# Patient Record
Sex: Female | Born: 1935 | Race: Black or African American | Hispanic: No | Smoking: Current every day smoker
Health system: Southern US, Community
[De-identification: ages and names within clinical notes are randomized; demographics above are authoritative.]

## PROBLEM LIST (undated history)

## (undated) DIAGNOSIS — I639 Cerebral infarction, unspecified: Secondary | ICD-10-CM

## (undated) DIAGNOSIS — I10 Essential (primary) hypertension: Secondary | ICD-10-CM

## (undated) DIAGNOSIS — E785 Hyperlipidemia, unspecified: Secondary | ICD-10-CM

---

## 2014-05-28 ENCOUNTER — Observation Stay: Payer: Self-pay | Admitting: Internal Medicine

## 2014-05-28 LAB — URINALYSIS, COMPLETE
BLOOD: NEGATIVE
Bilirubin,UR: NEGATIVE
GLUCOSE, UR: NEGATIVE mg/dL (ref 0–75)
KETONE: NEGATIVE
Nitrite: NEGATIVE
PH: 5 (ref 4.5–8.0)
Protein: NEGATIVE
SPECIFIC GRAVITY: 1.013 (ref 1.003–1.030)
Squamous Epithelial: 5

## 2014-05-28 LAB — COMPREHENSIVE METABOLIC PANEL
ALT: 13 U/L — AB
ANION GAP: 5 — AB (ref 7–16)
Albumin: 3.1 g/dL — ABNORMAL LOW (ref 3.4–5.0)
Alkaline Phosphatase: 114 U/L
BUN: 18 mg/dL (ref 7–18)
Bilirubin,Total: 0.3 mg/dL (ref 0.2–1.0)
CHLORIDE: 109 mmol/L — AB (ref 98–107)
CO2: 28 mmol/L (ref 21–32)
CREATININE: 1.69 mg/dL — AB (ref 0.60–1.30)
Calcium, Total: 8.5 mg/dL (ref 8.5–10.1)
EGFR (African American): 37 — ABNORMAL LOW
EGFR (Non-African Amer.): 31 — ABNORMAL LOW
Glucose: 80 mg/dL (ref 65–99)
OSMOLALITY: 284 (ref 275–301)
POTASSIUM: 4.4 mmol/L (ref 3.5–5.1)
SGOT(AST): 11 U/L — ABNORMAL LOW (ref 15–37)
Sodium: 142 mmol/L (ref 136–145)
Total Protein: 7.3 g/dL (ref 6.4–8.2)

## 2014-05-28 LAB — CBC WITH DIFFERENTIAL/PLATELET
Basophil #: 0.3 10*3/uL — ABNORMAL HIGH (ref 0.0–0.1)
Basophil %: 4.5 %
Eosinophil #: 0.2 10*3/uL (ref 0.0–0.7)
Eosinophil %: 3.2 %
HCT: 40.4 % (ref 35.0–47.0)
HGB: 13.5 g/dL (ref 12.0–16.0)
Lymphocyte #: 1.6 10*3/uL (ref 1.0–3.6)
Lymphocyte %: 28 %
MCH: 35.2 pg — AB (ref 26.0–34.0)
MCHC: 33.4 g/dL (ref 32.0–36.0)
MCV: 106 fL — AB (ref 80–100)
MONO ABS: 0.8 x10 3/mm (ref 0.2–0.9)
Monocyte %: 13.3 %
NEUTROS PCT: 51 %
Neutrophil #: 3 10*3/uL (ref 1.4–6.5)
PLATELETS: 236 10*3/uL (ref 150–440)
RBC: 3.83 10*6/uL (ref 3.80–5.20)
RDW: 14.3 % (ref 11.5–14.5)
WBC: 5.8 10*3/uL (ref 3.6–11.0)

## 2014-05-28 LAB — TROPONIN I

## 2014-05-28 LAB — APTT: ACTIVATED PTT: 26.8 s (ref 23.6–35.9)

## 2014-05-29 ENCOUNTER — Ambulatory Visit: Payer: Self-pay | Admitting: Neurology

## 2014-05-29 LAB — LIPID PANEL
Cholesterol: 159 mg/dL (ref 0–200)
HDL: 42 mg/dL (ref 40–60)
Ldl Cholesterol, Calc: 99 mg/dL (ref 0–100)
TRIGLYCERIDES: 90 mg/dL (ref 0–200)
VLDL Cholesterol, Calc: 18 mg/dL (ref 5–40)

## 2014-09-21 NOTE — Consult Note (Signed)
PATIENT NAME:  Carolyn Kline, Carolyn Kline MR#:  [id number] DATE OF BIRTH:  02-07-1929  DATE OF CONSULTATION:  05/29/2014   REFERRING PHYSICIAN:   CONSULTING PHYSICIAN:  Leotis Pain, MD  REASON FOR CONSULTATION: Left facial droop.   HISTORY OF PRESENT ILLNESS: This is an 81 year old African-American female with a questionable past medical history of stroke, on Aggrenox at baseline, who presents with what is described as a left facial droop. The patient is status post stroke workup including MRI of the brain which did not show any acute intracranial pathology.   REVIEW OF SYSTEMS: Denies fever, chills or fatigue. Denies any blurry or double vision. Denies any tinnitus. Denies any hearing loss. Denies any shortness of breath. Denies any cough. Denies any chest pain or abdominal pain. No heat or cold intolerance. Denies any anxiety or depression.   PAST MEDICAL HISTORY: Stroke in the past, essential hypertension, hyperlipidemia.   SOCIAL HISTORY: Positive for tobacco use at close to 1 pack per day.   FAMILY HISTORY: Positive for stroke and strong family history for smoking.   ALLERGIES: No known drug allergies.   HOME MEDICATIONS: Reviewed.   PHYSICAL EXAMINATION:  NEUROLOGIC: The patient is awake, alert and oriented to time, place, location and the reason why she is in the hospital. Sensation intact. A left facial droop is noted. The patient has difficulty completely closing her left eye. Shoulder shrug intact. Motor strength 5/5 in the bilateral upper and lower extremities Sensation intact to light touch and temperature. Reflexes diminished. No tinnitus present.   ASSESSMENT AND PLAN: This is an 81 year old African American female who is presenting with left facial droop and negative MRI. The patient's symptoms are consistent with Bell palsy on the left side, peripheral loss of the seventh nerve. The patient is able to close her left eye, but it is weaker than her eye right eye. Since she is able to  close it completely, I do not think she needs Lacri-Lube at this point.   PLAN: Discharge planning. I am not so convinced the patient needs it at this time, specifically since she is an elderly patient. Follow up as an outpatient as needed. This case was discussed with the family at the bedside.    ____________________________ Leotis Pain, MD yz:JT D: 05/29/2014 13:15:44 ET T: 05/29/2014 15:34:03 ET JOB#: [id number]  cc: Leotis Pain, MD, <Dictator> Leotis Pain MD ELECTRONICALLY SIGNED 06/14/2014 21:10

## 2014-09-21 NOTE — H&P (Signed)
PATIENT NAME:  BARBA, Carolyn Kline MR#:  [id number] DATE OF BIRTH:  December 28, 1928  DATE OF ADMISSION:  05/28/2014  REFERRING PHYSICIAN: Debbrah Alar, MD  PRIMARY CARE PHYSICIAN: Dr. Scarlett Presto out of [facility].   CHIEF COMPLAINT: Left face droop.   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 She eventually came after the insistence of her family who noticed acute onset of  facial droop as stated above. However, denies any further symptomatology, including paralysis, paresthesias, or further weakness. No slurred speech or further symptoms per patient and family members at bedside. Symptoms have somewhat improved, but still present.   REVIEW OF SYSTEMS:  CONSTITUTIONAL: Denies fevers, chills, fatigue.  EYES: Denies blurred vision, double vision, or eye pain.  HEENT: Denies tinnitus, ear pain, or hearing loss.  RESPIRATORY: Denies cough, wheeze, or shortness of breath.  CARDIOVASCULAR: No chest pain, palpitations, or edema.  GASTROINTESTINAL: Denies nausea, vomiting, diarrhea, or abdominal pain.  GENITOURINARY: Denies dysuria or hematuria.  ENDOCRINE: Denies nocturia or thyroid problems.  HEMATOLOGIC/LYMPHATIC: Denies easy bruising or bleeding.  SKIN: Denies rashes or lesions.  MUSCULOSKELETAL: Denies pain in neck, back, shoulder, knees, hips, or arthritic symptoms.  NEUROLOGIC: Positive for left-sided facial droop as stated above. Denies any further numbness, weakness, dysarthria, ataxia, or headaches.  PSYCHIATRIC: Denies anxiety or depressive symptoms.   Otherwise, full review of systems performed by me is negative.   PAST MEDICAL HISTORY: Includes CVA without residual neurological deficits, essential hypertension, hyperlipidemia--unspecified.   SOCIAL HISTORY:  Positive for tobacco use. Denies any alcohol or drug use.   FAMILY HISTORY: Positive for CVA as well as coronary artery disease.   ALLERGIES: No known drug allergies.   HOME MEDICATIONS: Include lisinopril 40 mg p.o. daily, simvastatin unknown dosage daily at bedtime, Aggrenox 25/200 mg p.o. b.i.d.   PHYSICAL EXAMINATION:  VITAL SIGNS: Temperature 98.1, heart rate 59, respirations 16, blood pressure 140/73, saturating 100% on room air. Weight 60.3 kg, BMI 22.1.  GENERAL: Weak-appearing African American female currently in no acute distress.  HEAD: Normocephalic, atraumatic.  EYES: Pupils equal, round, and reactive to light. Extraocular muscles intact. No scleral icterus.  MOUTH: Moist mucosal membranes. Dentition intact. No abscess noted.  EARS, NOSE, THROAT: Clear without exudates. No external lesions.  NECK: Supple. No thyromegaly. No nodules. No JVD.  PULMONARY: Clear to auscultation bilaterally without wheeze, rales, or rhonchi. No use of accessory muscles. Good respiratory effort.  CHEST: Nontender to palpation.  CARDIOVASCULAR: S1, S2. Regular rate and rhythm. No murmurs, rubs, or gallops. No edema. Pedal pulses 2+ bilaterally.  GASTROINTESTINAL: Soft, nontender, nondistended. No masses. Positive bowel sounds. No hepatosplenomegaly.  MUSCULOSKELETAL: No swelling, clubbing, or edema. Range of motion full in all extremities.  NEUROLOGIC: Prominent left-sided facial droop. Pronator drift within normal limits. Strength 5/5 in all extremities, including proximal and distal flexion and extension. Sensation intact. Reflexes intact.  SKIN: No ulceration, lesions, rashes, or cyanosis. Skin warm, dry. Turgor intact.  PSYCHIATRIC: Mood and affect are flattened. She is awake, alert, oriented x 3. Insight and judgment are intact.   LABORATORY DATA: Sodium 142, potassium 4.4, chloride 109, bicarbonate 28, BUN 18, creatinine 1.69, unknown baseline, glucose 80. Albumin of 3.1, otherwise LFTs within  normal limits. WBC 5.8, hemoglobin 13.5, platelets of 236,000. CT head performed which reveals remote-appearing white matter infarction of the right caudate,  as well as small left cortical cerebral hemisphere infarct. EKG performed reveals sinus bradycardia, heart rate 58, with right bundle branch block. No old to compare to.   ASSESSMENT AND PLAN: An 81 year old African American female with history of cerebrovascular accident without residual neurological deficit, presenting with left-sided facial drooping for the last 24 hours.  1.  Cerebrovascular accident, unspecified mechanism: Place on telemetry. Neurological checks every 4 hours. We are going to check a lipid panel, MRI, carotid Doppler, transthoracic echocardiogram. Continue with Aggrenox and statin therapy.  2.  Hypertension: She is presenting after 24 hours of initial onset of symptoms. We can continue her blood pressure medication, her lisinopril.  3.  Hyperlipidemia: Statin therapy.  4.  Venous thromboembolism prophylaxis with sequential compression devices.   CODE STATUS: The patient is FULL CODE.   TIME SPENT: 45 minutes.    ____________________________ Aaron Mose. Antavius Sperbeck, MD dkh:ts D: 05/28/2014 20:42:42 ET T: 05/28/2014 21:39:18 ET JOB#: [id number]  cc: Aaron Mose. Jossalin Chervenak, MD, <Dictator> Lauran Romanski Woodfin Ganja MD ELECTRONICALLY SIGNED 05/29/2014 20:37

## 2014-09-25 NOTE — Discharge Summary (Signed)
PATIENT NAME:  Carolyn Kline, Carolyn Kline MR#:  [id number] DATE OF BIRTH:  1928-09-12  DATE OF ADMISSION:  05/28/2014 DATE OF DISCHARGE:  05/29/2014  PRIMARY CARE PHYSICIAN: Non-local.  DISCHARGE DIAGNOSES:  1. Left facial droop, Bell palsy. 2. History of cerebrovascular accident. 3. Bilateral carotid and proximal internal carotid artery stenosis, less than 50%.  4. Renal insufficiency.  5. Hypertension.  6. Hyperlipidemia.  7. Tobacco abuse.   CONDITION: Stable.  CODE STATUS: Full code.   HOME MEDICATIONS: Please refer to the medication reconciliation list.   DIET: Low-sodium, low-fat, low-cholesterol diet.   ACTIVITY: As tolerated.  FOLLOW-UP CARE: Follow up with PCP within 1 to 2 weeks.   HOSPITAL COURSE: The patient is an 81 year old African-American female with a history of hypertension, CVA, hyperlipidemia, who presented to the ED with left facial droop. The patient denies any other symptoms. For detailed history and physical examination, please refer to the admission note dictated by Dr. Lavetta Nielsen. The patient was admitted for CVA After admission, the patient got an MRI of the brain, which did not show any acute cerebrovascular accident. Carotid duplex showed internal carotid artery stenosis less than 50%. The patient had echocardiograph. The patient has been treated with Aggrenox and statin. Hypertension has been controlled with lisinopril. The patient has no complaints except mild left-sided facial droop. The patient is clinically stable and will be discharged to home today after neurology consult evaluation. In addition, the patient has tobacco abuse, which was counseled for smoking cessation and we will give a nicotine patch. Per Dr. Irish Elders, patient has Bell's palsy.  I discussed the patient's discharge plan with the patient, the patient's family members, and nurse.  TIME SPENT: About 43 minutes..    ____________________________ Demetrios Loll, MD qc:jh D: 05/29/2014 14:51:24  ET T: 05/29/2014 18:02:08 ET JOB#: [id number]  cc: Demetrios Loll, MD, <Dictator> Demetrios Loll MD ELECTRONICALLY SIGNED 05/30/2014 17:04

## 2015-03-27 ENCOUNTER — Emergency Department: Payer: Medicare Other

## 2015-03-27 ENCOUNTER — Encounter: Payer: Self-pay | Admitting: Emergency Medicine

## 2015-03-27 ENCOUNTER — Emergency Department
Admission: EM | Admit: 2015-03-27 | Discharge: 2015-03-27 | Disposition: A | Discharge status: Home / Self Care | Payer: Medicare Other | Attending: Emergency Medicine | Admitting: Emergency Medicine

## 2015-03-27 DIAGNOSIS — Z79899 Other long term (current) drug therapy: Secondary | ICD-10-CM | POA: Insufficient documentation

## 2015-03-27 DIAGNOSIS — J189 Pneumonia, unspecified organism: Secondary | ICD-10-CM

## 2015-03-27 DIAGNOSIS — Z7982 Long term (current) use of aspirin: Secondary | ICD-10-CM | POA: Diagnosis not present

## 2015-03-27 DIAGNOSIS — Z720 Tobacco use: Secondary | ICD-10-CM | POA: Insufficient documentation

## 2015-03-27 DIAGNOSIS — R0602 Shortness of breath: Secondary | ICD-10-CM | POA: Diagnosis present

## 2015-03-27 DIAGNOSIS — I10 Essential (primary) hypertension: Secondary | ICD-10-CM | POA: Diagnosis not present

## 2015-03-27 DIAGNOSIS — J159 Unspecified bacterial pneumonia: Secondary | ICD-10-CM | POA: Diagnosis not present

## 2015-03-27 HISTORY — DX: Hyperlipidemia, unspecified: E78.5

## 2015-03-27 HISTORY — DX: Cerebral infarction, unspecified: I63.9

## 2015-03-27 HISTORY — DX: Essential (primary) hypertension: I10

## 2015-03-27 LAB — CBC
HEMATOCRIT: 40.3 % (ref 35.0–47.0)
Hemoglobin: 13.2 g/dL (ref 12.0–16.0)
MCH: 33.2 pg (ref 26.0–34.0)
MCHC: 32.7 g/dL (ref 32.0–36.0)
MCV: 101.4 fL — ABNORMAL HIGH (ref 80.0–100.0)
Platelets: 357 10*3/uL (ref 150–440)
RBC: 3.97 MIL/uL (ref 3.80–5.20)
RDW: 14.2 % (ref 11.5–14.5)
WBC: 8 10*3/uL (ref 3.6–11.0)

## 2015-03-27 LAB — BASIC METABOLIC PANEL
ANION GAP: 9 (ref 5–15)
BUN: 18 mg/dL (ref 6–20)
CALCIUM: 9.6 mg/dL (ref 8.9–10.3)
CO2: 25 mmol/L (ref 22–32)
Chloride: 104 mmol/L (ref 101–111)
Creatinine, Ser: 1.49 mg/dL — ABNORMAL HIGH (ref 0.44–1.00)
GFR, EST AFRICAN AMERICAN: 36 mL/min — AB (ref >60)
GFR, EST NON AFRICAN AMERICAN: 31 mL/min — AB (ref >60)
Glucose, Bld: 119 mg/dL — ABNORMAL HIGH (ref 65–99)
POTASSIUM: 3.9 mmol/L (ref 3.5–5.1)
SODIUM: 138 mmol/L (ref 135–145)

## 2015-03-27 LAB — TROPONIN I

## 2015-03-27 MED ORDER — AZITHROMYCIN 250 MG PO TABS
500.0000 mg | ORAL_TABLET | Freq: Once | ORAL | Status: AC
  Administered 2015-03-27: 500 mg via ORAL
  Filled 2015-03-27: qty 2

## 2015-03-27 MED ORDER — ALBUTEROL SULFATE HFA 108 (90 BASE) MCG/ACT IN AERS: 2.0000 | INHALATION_SPRAY | Freq: Four times a day (QID) | RESPIRATORY_TRACT | Status: DC | PRN

## 2015-03-27 MED ORDER — AZITHROMYCIN 250 MG PO TABS: 250.0000 mg | ORAL_TABLET | Freq: Every day | ORAL | Status: DC

## 2015-03-27 NOTE — ED Notes (Signed)
Pt [facility] from [facility] with c/o cough x 2 weeks and low o2 with walking.

## 2015-03-27 NOTE — Discharge Instructions (Signed)

## 2015-03-27 NOTE — ED Provider Notes (Signed)
[facility] Emergency Department Provider Note  ____________________________________________  Time seen: Approximately 130 PM  I have reviewed the triage vital signs and the nursing notes.   HISTORY  Chief Complaint Shortness of Breath and Cough    HPI Carolyn Kline is a 81 y.o. female with a history of hypertension who is presenting today with cough over the last 2 weeks. She has had significant sick contact of her relative with pneumonia. She denies any chest pain or shortness of breath at this time. Was seen at the urgent care this morning and was sent in for an oxygen saturation of 90% when walking.She says that she has not had any recent hospitalizations or antibiotics. Denies any fever at home.    Past Medical History  Diagnosis Date  . Hypertension   . CVA (cerebral infarction)   . Hyperlipemia     There are no active problems to display for this patient.   History reviewed. No pertinent past surgical history.  Current Outpatient Rx  Name  Route  Sig  Dispense  Refill  . calcium carbonate (OSCAL) 1500 (600 CA) MG TABS tablet   Oral   Take by mouth 2 (two) times daily with a meal.         . dipyridamole-aspirin (AGGRENOX) 200-25 MG 12hr capsule   Oral   Take 1 capsule by mouth 2 (two) times daily.         Marland Kitchen lisinopril (PRINIVIL,ZESTRIL) 40 MG tablet   Oral   Take 40 mg by mouth daily.         . simvastatin (ZOCOR) 40 MG tablet   Oral   Take 40 mg by mouth daily.           Allergies Review of patient's allergies indicates no known allergies.  No family history on file.  Social History Social History  Substance Use Topics  . Smoking status: Current Every Day Smoker  . Smokeless tobacco: None  . Alcohol Use: No    Review of Systems Constitutional: No fever/chills Eyes: No visual changes. ENT: No sore throat. Cardiovascular: Denies chest pain. Respiratory: Cough  Gastrointestinal: No abdominal pain.  No nausea, no  vomiting.  No diarrhea.  No constipation. Genitourinary: Negative for dysuria. Musculoskeletal: Negative for back pain. Skin: Negative for rash. Neurological: Negative for headaches, focal weakness or numbness.  10-point ROS otherwise negative.  ____________________________________________   PHYSICAL EXAM:  VITAL SIGNS: ED Triage Vitals  Enc Vitals Group     BP 03/27/15 1238 128/70 mmHg     Pulse Rate 03/27/15 1239 57     Resp --      Temp --      Temp src --      SpO2 03/27/15 1239 95 %     Weight 03/27/15 1111 110 lb (49.896 kg)     Height 03/27/15 1111 5\' 5"  (1.651 m)     Head Cir --      Peak Flow --      Pain Score 03/27/15 1112 0     Pain Loc --      Pain Edu? --      Excl. in [place]? --     Constitutional: Alert and oriented. Well appearing and in no acute distress. Eyes: Conjunctivae are normal. PERRL. EOMI. Head: Atraumatic. Nose: No congestion/rhinnorhea. Mouth/Throat: Mucous membranes are moist.  Oropharynx non-erythematous. Neck: No stridor.   Cardiovascular: Normal rate, regular rhythm. Grossly normal heart sounds.  Good peripheral circulation. Respiratory: Normal respiratory effort.  No retractions. Rales to the left lower field. Gastrointestinal: Soft and nontender. No distention. No abdominal bruits. No CVA tenderness. Musculoskeletal: No lower extremity tenderness nor edema.  No joint effusions. Neurologic:  Normal speech and language. No gross focal neurologic deficits are appreciated. No gait instability. Skin:  Skin is warm, dry and intact. No rash noted. Psychiatric: Mood and affect are normal. Speech and behavior are normal.  ____________________________________________   LABS (all labs ordered are listed, but only abnormal results are displayed)  Labs Reviewed  BASIC METABOLIC PANEL - Abnormal; Notable for the following:    Glucose, Bld 119 (*)    Creatinine, Ser 1.49 (*)    GFR calc non Af Amer 31 (*)    GFR calc Af Amer 36 (*)    All other  components within normal limits  CBC - Abnormal; Notable for the following:    MCV 101.4 (*)    All other components within normal limits  TROPONIN I   ____________________________________________  EKG  ED ECG REPORT I, Doran Stabler, the attending physician, personally viewed and interpreted this ECG.   Date: 03/27/2015  EKG Time: 1133  Rate: 70  Rhythm: normal sinus rhythm  Axis: Normal axis  Intervals:Incomplete right bundle-branch block  ST&T Change: No ST segment elevation or depression. T-wave inversion in lead V2.  No change from 05/28/2014. ____________________________________________  RADIOLOGY  Probable left lower lobe pneumonia. I personally reviewed these films. ____________________________________________   PROCEDURES   ____________________________________________   INITIAL IMPRESSION / ASSESSMENT AND PLAN / ED COURSE  Pertinent labs & imaging results that were available during my care of the patient were reviewed by me and considered in my medical decision making (see chart for details).  ----------------------------------------- 2:33 PM on 03/27/2015 -----------------------------------------  Patient continues to rest comfortably. Advised about reassuring lab workup. Also discussed stopping smoking because of COPD found on x-ray as well as pneumonia which could only stand to be exacerbated by continued smoking of the one half pack per day habit the patient has as of now.  The patient as well as the family understand and are willing to comply. The patient will be discharged to home with Zithromax. She will follow up with her primary care doctor at [facility] in [place] within the next 7 days. ____________________________________________   FINAL CLINICAL IMPRESSION(S) / ED DIAGNOSES  Acute pneumonia, community-acquired. Initial visit.    Orbie Pyo, MD 03/27/15 [id number]

## 2015-03-27 NOTE — ED Notes (Signed)
Today went to walk in clinic for cough past 2 weeks, non productive,lives with family

## 2019-03-25 ENCOUNTER — Other Ambulatory Visit: Payer: Self-pay

## 2019-03-25 ENCOUNTER — Ambulatory Visit: Payer: Medicare HMO | Attending: Gastroenterology

## 2019-03-25 DIAGNOSIS — M62838 Other muscle spasm: Secondary | ICD-10-CM | POA: Diagnosis not present

## 2019-03-25 DIAGNOSIS — R278 Other lack of coordination: Secondary | ICD-10-CM | POA: Insufficient documentation

## 2019-03-25 DIAGNOSIS — R2689 Other abnormalities of gait and mobility: Secondary | ICD-10-CM | POA: Insufficient documentation

## 2019-03-25 DIAGNOSIS — R293 Abnormal posture: Secondary | ICD-10-CM | POA: Insufficient documentation

## 2019-03-25 NOTE — Patient Instructions (Signed)
   EXhale on EXertion!!!!! (pushing, pulling, lifting, standing, etc.)  Good Education:  Whenever you exert force you need to exhale to allow the pressure to escape out of your vocal cords rather than be pressed down through your pelvic floor. Exhaling also helps engage your deep-core muscles to protect your back and pelvic floor.   1. Belly Breathing:  Stabilization: Diaphragmatic Breathing    Lie with knees bent, feet flat. Place one hand on stomach, other on chest. Breathe deeply through nose, lifting belly hand without any motion of hand on chest. Repeat __2__ times per set. Do __10__ sets per session. Perform every day, twice a day.

## 2019-03-25 NOTE — Therapy (Signed)
[place] MAIN [facility] SERVICES [address] [place], [place], [address] Phone: [phone]   Fax:  [phone]  Physical Therapy Evaluation The patient has been informed of current processes in place at Outpatient Rehab to protect patients from Covid-19 exposure including social distancing, schedule modifications, and new cleaning procedures. After discussing their particular risk with a therapist based on the patient's personal risk factors, the patient has decided to proceed with in-person therapy.  Patient Details  Name: Carolyn Kline MRN: [id number] Date of Birth: 23-Feb-1929 No data recorded  Encounter Date: 03/25/2019  PT End of Session - 03/25/19 1327    Visit Number  1    Number of Visits  10    Date for PT Re-Evaluation  06/03/19    Authorization - Visit Number  1    Authorization - Number of Visits  10    PT Start Time  1130    PT Stop Time  6301    PT Time Calculation (min)  65 min    Activity Tolerance  Patient tolerated treatment well;No increased pain    Behavior During Therapy  WFL for tasks assessed/performed       Past Medical History:  Diagnosis Date  . CVA (cerebral infarction)   . Hyperlipemia   . Hypertension     No past surgical history on file.  There were no vitals filed for this visit.    Pelvic Floor Physical Therapy Evaluation and Assessment  SCREENING  Falls in last 6 mo: No  Interpreter Needed?: No Interpreter Agency:  Interpreter Name  Interpreter ID  Patient Declined Interpreter   Patient signed [place] waiver    Patient's communication preference: English  Red Flags:  Have you had any night sweats? No  Unexplained weight loss? Yes (about 2 years ago)  Saddle anesthesia? -*Not asked this session  Unexplained changes in bowel or bladder habits? Yes (fecal incontinence is new in the last year)   SUBJECTIVE  Patient reports: Has to go to the bathroom all the time, every time she  drinks something. Has (formed stool before and after bathroom use) leakage and high urgency for a bowel movement. Urge related urinary incontinence (sitting position is a trigger). "I better go because it's coming anyway". Has had these symptoms for about a year - "at least since the first of the year". Patients does not drink a lot water. Loves coffee in the morning- about 2 cups- and drinking Ensure Shakes. Patient reports she has COPD. And that she has been "forgetting a lot" lately and "feels off balance" a lot.    ADL limitations: requiring upper extremity use; lifting items up and down.   *Unplanned weight loss over the last 2 years. Pt reports a big loss of appetite over the last year. Diet includes eating breakfast and dinner and "maybe snacks throughout the day".  If she's hungry for breakfast she will have Ensure or toast with egg. She often reports skipping dinner, and if she does she will make a sandwich/what ever the grandson cooks (often not hungry). She reports she has lost all smell and taste. Additionally patient reports she get very fatigued early in the day. Example, she will get up, clean the kitchen and then have to sleep for the rest of the day.   Reports she goes to her PCP once a year and at her annual at her OB-GYN this year they looked at her kidneys. Planned pessiery (next 6 weeks).  Precautions:  N/A  Social/Family/Vocational History:   "Works in the house." 1 story home. About 4-5 STE, 1 railing present and wall on the other side. Lives with your grandson- works at [employer].   Recent Procedures/Tests/Findings:  "says the vagina is pushing up against the rectum" Often feels like there is a bulge around her vagina when she goes to the bathroom "feels like a penis is going to come out"   Obstetrical History: 5 children (6 grandchildren)   Gynecological History: Unknown   Urinary History: Unknown- frequent urination; feeling of incomplete voiding.   Gastrointestinal  History: Leakage; no strain to have a bowel movement. Stage 3 anterior prolapse [facility])    Sexual activity/pain: Not sexually active.   Location of pain: "I don't feel pain"   Patient Goals: More strength in my arms.    OBJECTIVE  Posture/Observations:  Sitting: anterior pelvic tilt; rounded shoulders  Standing: anterior pelvic tilt; L shoulder up; R shoulder down; minimal R lateral lean    Palpation/Segmental Motion/Joint Play: - Hypomobility throughout cervical/thoracic spine v Hypermobility along lumbar spine  -Hypomobility at T/L junction v Hyper mobility at L/S junction  - Hypomobility along sacral borders.  - TTP B illiacus, psoas, adductors   Special tests:   Supine to long sit: L LE longer; R up-slip noted in supine.   LLD: <1cm difference L longer than R (R up-slip and anterior innominate rotation)   Range of Motion/Flexibilty:  Spine: About 35% of rotation; Side bending more movement to the R;  L C-spine curve  Hips: R up-slip in supine; L leg longer in supine   * no full IR > ER ROM in seated position.  * L hamstrings are more flexible compared to R.    Strength/MMT:  LE MMT  LE MMT Left Right  Hip flex:  (L2) 5/5 5/5  Hip ext: /5 /5  Hip abd: /5 /5  Hip add: /5 /5  Hip IR /5 /5  Hip ER /5 /5    Abdominal:  Palpation: increased fascial tension at B lower quadrants; poor coordination of deep core with coughing; poor rib excursion with breathing.   Diastasis: 1 finger with abdominal engagement; <1 finger with SLR; decreased coordination   Pelvic Floor-- Deferred to follow up  External Exam: Introitus Appears:  Skin integrity:  Palpation: Cough: Prolapse visible?: Scar mobility:  Internal Vaginal Exam: Strength (PERF):  Symmetry: Palpation: Prolapse:   Internal Rectal Exam: Strength (PERF): Symmetry: Palpation: Prolapse:   Gait Analysis:  Minimal R lateral lean; decreased gait speed; decreased hip extension    Pelvic Floor Outcome  Measures: Female NIH: 14/43 (33%); FISI 27/72 (score >30 are associated with impaired QOL)   INTERVENTIONS THIS SESSION: There Act: Diaphragmatic breathing in hook-lying for decreased intraabdominal pressure with tactile cues. (10 minutes)   Self Care; Educated on the structure and function of the pelvic floor in relation to their symptoms as well as the POC, and initial HEP in order to set patient expectations and understanding from which we will build on in the future sessions. Sharren [place] can analogy given in written format on HEP). (20 minutes)    Total time: 65 minutes                 Objective measurements completed on examination: See above findings.                PT Short Term Goals - 03/25/19 1414      PT SHORT TERM GOAL #1   Title  Patient will  demonstrate functional recruitment of TA with breathing, sit-to-stand, squatting/lifting, and walking to allow for improved pelvic brace coordination, improved balance, and decreased downward pressure on the pelvic organs.    Baseline  10-29: poor coordination of deep core; weak rib excursion with breathing; DR present    Time  5    Period  Weeks    Status  New    Target Date  04/29/19      PT SHORT TERM GOAL #2   Title  Patient will demonstrate HEP x1 in the clinic to demonstrate understanding and proper form to allow for further improvement.    Baseline  10-29: Soda Can analogy given    Time  5    Period  Weeks    Status  New    Target Date  04/29/19      PT SHORT TERM GOAL #3   Title  Patient will demonstrate improved water intake (6-8 cups a day) throughout the day- assisting with improved hydration to assit with adequate urine and bowel movement output.    Baseline  10-29: <1 cup of water a day.    Time  5    Period  Weeks    Status  New    Target Date  04/29/19      PT SHORT TERM GOAL #4   Title  Pt will report being able to lift during ADLs without impaired fatigue following, in order to maintain  patients independence and reduce risk of falls.    Baseline  10-29: lifting objects overhead are difficult, impacting QOL and ADLs.    Time  5    Period  Weeks    Status  New    Target Date  04/29/19        PT Long Term Goals - 03/25/19 1419      PT LONG TERM GOAL #1   Title  Patient will report no episodes of fecal incontinence over the course of the prior two weeks to demonstrate improved functional ability.    Baseline  10-29 solid stool incontience before and after BM (at least 1x/day)    Time  10    Period  Weeks    Status  New    Target Date  06/03/19      PT LONG TERM GOAL #2   Title  Patient will score less than or equal to 15% on the Female NIH-CPSI to demonstrate a reduction in pain, urinary symptoms, and an improved quality of life.    Baseline  10-29: 33%    Time  10    Period  Weeks    Status  New    Target Date  06/03/19      PT LONG TERM GOAL #3   Title  Pt will report <10/72 on Fecal Incontience Severity Index (FISI) indicating improved fecal control.    Baseline  10-29 27/72    Time  10    Period  Weeks    Status  New    Target Date  06/03/19             Plan - 03/25/19 1328    Clinical Impression Statement  Patient is an 85 y.o. female who comes to PT with chief complaint of fecal incontience, frequent urinary voiding, and feeling of incomplete voiding. Patient has a relevant PMH of HTN, HLD, COPD and CVA (of unknown year). From pt's chart revealed grade 3 anterior compartment prolapse. Physical therapy eval revealed spinal L C-curve, R up-slip, diastisi recti, poor  coordination of deep core (with stress), TTP along hip flexors and hip stabilzing musculature. This implies that pelvic alignment, musculature spasms and posture play a component in patient's symptoms. Patient will benefit from skilled pelvic health PT to address prolapse, pelvic alignment, muscle spasms, coordination and pt education to reduce fecal incontinence and urinary related symptoms.    Patient demonstrates concerns for possible other diagnoses- these concerns are derived from unexplained weight loss, possible reduced sensation ("I don't feel pain"), and reduced appetite.    Personal Factors and Comorbidities  Age;Behavior Pattern;Comorbidity 1    Comorbidities  COPD; low BMI (underweight 18.3)    Examination-Activity Limitations  Bed Mobility;Continence;Lift;Locomotion Level    Examination-Participation Restrictions  Community Activity;Driving;Interpersonal Relationship    Stability/Clinical Decision Making  Evolving/Moderate complexity    Clinical Decision Making  Moderate    Rehab Potential  Good    PT Frequency  1x / week   pt may be limited by personal factors   PT Duration  Other (comment)   10 weeks   PT Treatment/Interventions  ADLs/Self Care Home Management;Biofeedback;Electrical Stimulation;Moist Heat;Traction;Stair training;Gait training;Balance training;Therapeutic exercise;Patient/family education;Therapeutic activities;Functional mobility training;Neuromuscular re-education;Manual techniques;Energy conservation;Vestibular;Spinal Manipulations;Joint Manipulations;Cryotherapy;Dry needling    PT Next Visit Plan  *ask abuse/adv directives etc; correct pelvic alignment; possible heel lift given; address TPs; increase core stability    PT Home Exercise Plan  diaphragmatic breathing    Consulted and Agree with Plan of Care  Patient;Family member/caregiver    Family Member Consulted  pt's daughter       Patient will benefit from skilled therapeutic intervention in order to improve the following deficits and impairments:  Abnormal gait, Decreased activity tolerance, Decreased coordination, Decreased balance, Decreased endurance, Decreased mobility, Decreased strength, Hypomobility, Increased fascial restricitons, Impaired flexibility, Increased muscle spasms, Postural dysfunction, Decreased range of motion  Visit Diagnosis: Other muscle spasm  Abnormal  posture  Other lack of coordination  Other abnormalities of gait and mobility     Problem List There are no active problems to display for this patient.   Tomasita Morrow, SPT  03/25/2019, 2:37 PM  [place] MAIN [facility] SERVICES [address] [place], [place], [address] Phone: [phone]   Fax:  [phone]  Name: Carolyn Kline MRN: [id number] Date of Birth: 1928-10-15

## 2019-04-01 ENCOUNTER — Ambulatory Visit: Payer: Medicare HMO | Attending: Gastroenterology

## 2019-04-08 ENCOUNTER — Ambulatory Visit: Payer: Medicare HMO

## 2019-04-15 ENCOUNTER — Ambulatory Visit: Payer: Medicare HMO

## 2019-04-29 ENCOUNTER — Ambulatory Visit: Payer: Medicare HMO

## 2019-05-06 ENCOUNTER — Ambulatory Visit: Payer: Medicare HMO

## 2019-05-13 ENCOUNTER — Ambulatory Visit: Payer: Medicare HMO

## 2019-07-15 ENCOUNTER — Other Ambulatory Visit: Payer: Self-pay

## 2019-07-15 ENCOUNTER — Inpatient Hospital Stay (HOSPITAL_COMMUNITY)
Admission: AD | Admit: 2019-07-15 | Discharge: 2019-07-17 | DRG: 177 | Disposition: A | Discharge status: Home Health | Payer: Medicare HMO | Source: Other Acute Inpatient Hospital | Attending: Internal Medicine | Admitting: Internal Medicine

## 2019-07-15 ENCOUNTER — Emergency Department: Payer: Medicare HMO

## 2019-07-15 ENCOUNTER — Encounter (HOSPITAL_COMMUNITY): Payer: Self-pay | Admitting: Internal Medicine

## 2019-07-15 ENCOUNTER — Inpatient Hospital Stay
Admission: EM | Admit: 2019-07-15 | Discharge: 2019-07-15 | DRG: 177 | Disposition: A | Discharge status: Other Acute Inpatient Hospital | Payer: Medicare HMO | Attending: Internal Medicine | Admitting: Internal Medicine

## 2019-07-15 DIAGNOSIS — Z8673 Personal history of transient ischemic attack (TIA), and cerebral infarction without residual deficits: Secondary | ICD-10-CM

## 2019-07-15 DIAGNOSIS — E860 Dehydration: Secondary | ICD-10-CM | POA: Diagnosis present

## 2019-07-15 DIAGNOSIS — E785 Hyperlipidemia, unspecified: Secondary | ICD-10-CM | POA: Diagnosis present

## 2019-07-15 DIAGNOSIS — N170 Acute kidney failure with tubular necrosis: Secondary | ICD-10-CM

## 2019-07-15 DIAGNOSIS — I129 Hypertensive chronic kidney disease with stage 1 through stage 4 chronic kidney disease, or unspecified chronic kidney disease: Secondary | ICD-10-CM | POA: Diagnosis present

## 2019-07-15 DIAGNOSIS — N1831 Chronic kidney disease, stage 3a: Secondary | ICD-10-CM | POA: Diagnosis present

## 2019-07-15 DIAGNOSIS — J9601 Acute respiratory failure with hypoxia: Secondary | ICD-10-CM | POA: Diagnosis present

## 2019-07-15 DIAGNOSIS — A0839 Other viral enteritis: Secondary | ICD-10-CM | POA: Diagnosis not present

## 2019-07-15 DIAGNOSIS — U071 COVID-19: Secondary | ICD-10-CM | POA: Diagnosis present

## 2019-07-15 DIAGNOSIS — I10 Essential (primary) hypertension: Secondary | ICD-10-CM | POA: Diagnosis not present

## 2019-07-15 DIAGNOSIS — F17200 Nicotine dependence, unspecified, uncomplicated: Secondary | ICD-10-CM | POA: Diagnosis present

## 2019-07-15 DIAGNOSIS — J1282 Pneumonia due to coronavirus disease 2019: Secondary | ICD-10-CM | POA: Diagnosis present

## 2019-07-15 DIAGNOSIS — N179 Acute kidney failure, unspecified: Secondary | ICD-10-CM | POA: Diagnosis present

## 2019-07-15 DIAGNOSIS — Z66 Do not resuscitate: Secondary | ICD-10-CM | POA: Diagnosis present

## 2019-07-15 DIAGNOSIS — F0150 Vascular dementia without behavioral disturbance: Secondary | ICD-10-CM | POA: Diagnosis present

## 2019-07-15 DIAGNOSIS — R350 Frequency of micturition: Secondary | ICD-10-CM | POA: Diagnosis present

## 2019-07-15 DIAGNOSIS — Z79899 Other long term (current) drug therapy: Secondary | ICD-10-CM | POA: Diagnosis not present

## 2019-07-15 DIAGNOSIS — R197 Diarrhea, unspecified: Secondary | ICD-10-CM | POA: Diagnosis present

## 2019-07-15 DIAGNOSIS — J9600 Acute respiratory failure, unspecified whether with hypoxia or hypercapnia: Secondary | ICD-10-CM | POA: Diagnosis present

## 2019-07-15 DIAGNOSIS — Z23 Encounter for immunization: Secondary | ICD-10-CM

## 2019-07-15 DIAGNOSIS — E875 Hyperkalemia: Secondary | ICD-10-CM | POA: Diagnosis not present

## 2019-07-15 LAB — HEPATIC FUNCTION PANEL
ALT: 18 U/L (ref 0–44)
AST: 44 U/L — ABNORMAL HIGH (ref 15–41)
Albumin: 3.1 g/dL — ABNORMAL LOW (ref 3.5–5.0)
Alkaline Phosphatase: 63 U/L (ref 38–126)
Bilirubin, Direct: <0.1 mg/dL (ref 0.0–0.2)
Total Bilirubin: 0.4 mg/dL (ref 0.3–1.2)
Total Protein: 7.5 g/dL (ref 6.5–8.1)

## 2019-07-15 LAB — BASIC METABOLIC PANEL
Anion gap: 11 (ref 5–15)
BUN: 53 mg/dL — ABNORMAL HIGH (ref 8–23)
CO2: 21 mmol/L — ABNORMAL LOW (ref 22–32)
Calcium: 8.5 mg/dL — ABNORMAL LOW (ref 8.9–10.3)
Chloride: 108 mmol/L (ref 98–111)
Creatinine, Ser: 3.03 mg/dL — ABNORMAL HIGH (ref 0.44–1.00)
GFR calc Af Amer: 15 mL/min — ABNORMAL LOW (ref >60)
GFR calc non Af Amer: 13 mL/min — ABNORMAL LOW (ref >60)
Glucose, Bld: 133 mg/dL — ABNORMAL HIGH (ref 70–99)
Potassium: 4.5 mmol/L (ref 3.5–5.1)
Sodium: 140 mmol/L (ref 135–145)

## 2019-07-15 LAB — CBC
HCT: 41.8 % (ref 36.0–46.0)
Hemoglobin: 13.6 g/dL (ref 12.0–15.0)
MCH: 33.3 pg (ref 26.0–34.0)
MCHC: 32.5 g/dL (ref 30.0–36.0)
MCV: 102.5 fL — ABNORMAL HIGH (ref 80.0–100.0)
Platelets: 173 10*3/uL (ref 150–400)
RBC: 4.08 MIL/uL (ref 3.87–5.11)
RDW: 14 % (ref 11.5–15.5)
WBC: 6.5 10*3/uL (ref 4.0–10.5)
nRBC: 0 % (ref 0.0–0.2)

## 2019-07-15 LAB — PROCALCITONIN: Procalcitonin: 0.86 ng/mL

## 2019-07-15 LAB — POC SARS CORONAVIRUS 2 AG: SARS Coronavirus 2 Ag: POSITIVE — AB

## 2019-07-15 LAB — ABO/RH: ABO/RH(D): O POS

## 2019-07-15 LAB — C-REACTIVE PROTEIN: CRP: 11.2 mg/dL — ABNORMAL HIGH (ref <1.0)

## 2019-07-15 LAB — TROPONIN I (HIGH SENSITIVITY): Troponin I (High Sensitivity): 118 ng/L (ref <18)

## 2019-07-15 LAB — FIBRIN DERIVATIVES D-DIMER (ARMC ONLY): Fibrin derivatives D-dimer (ARMC): >7500 ng/mL (FEU) — ABNORMAL HIGH (ref 0.00–499.00)

## 2019-07-15 LAB — FERRITIN: Ferritin: 533 ng/mL — ABNORMAL HIGH (ref 11–307)

## 2019-07-15 MED ORDER — HYDROCOD POLST-CPM POLST ER 10-8 MG/5ML PO SUER: 5.0000 mL | Freq: Two times a day (BID) | ORAL | Status: DC | PRN

## 2019-07-15 MED ORDER — SODIUM CHLORIDE 0.9 % IV BOLUS
1000.0000 mL | Freq: Once | INTRAVENOUS | Status: AC
  Administered 2019-07-15: 1000 mL via INTRAVENOUS

## 2019-07-15 MED ORDER — ENOXAPARIN SODIUM 40 MG/0.4ML ~~LOC~~ SOLN: 40.0000 mg | SUBCUTANEOUS | Status: DC

## 2019-07-15 MED ORDER — ASCORBIC ACID 500 MG PO TABS
500.0000 mg | ORAL_TABLET | Freq: Every day | ORAL | Status: DC
  Administered 2019-07-16 – 2019-07-17 (×2): 500 mg via ORAL
  Filled 2019-07-15 (×2): qty 1

## 2019-07-15 MED ORDER — SODIUM CHLORIDE 0.9 % IV SOLN
100.0000 mg | Freq: Every day | INTRAVENOUS | Status: DC
  Filled 2019-07-15: qty 20

## 2019-07-15 MED ORDER — ACETAMINOPHEN 650 MG RE SUPP: 650.0000 mg | Freq: Four times a day (QID) | RECTAL | Status: DC | PRN

## 2019-07-15 MED ORDER — GUAIFENESIN-DM 100-10 MG/5ML PO SYRP
10.0000 mL | ORAL_SOLUTION | ORAL | Status: DC | PRN
  Filled 2019-07-15: qty 10

## 2019-07-15 MED ORDER — GUAIFENESIN-DM 100-10 MG/5ML PO SYRP: 10.0000 mL | ORAL_SOLUTION | ORAL | Status: DC | PRN

## 2019-07-15 MED ORDER — HYDROCOD POLST-CPM POLST ER 10-8 MG/5ML PO SUER: 5.0000 mL | Freq: Two times a day (BID) | ORAL | 0 refills | Status: DC | PRN

## 2019-07-15 MED ORDER — DOCUSATE SODIUM 100 MG PO CAPS
100.0000 mg | ORAL_CAPSULE | Freq: Every day | ORAL | Status: DC
  Administered 2019-07-16 – 2019-07-17 (×2): 100 mg via ORAL
  Filled 2019-07-15 (×2): qty 1

## 2019-07-15 MED ORDER — SODIUM CHLORIDE 0.9 % IV SOLN
200.0000 mg | Freq: Once | INTRAVENOUS | Status: AC
  Administered 2019-07-15: 200 mg via INTRAVENOUS
  Filled 2019-07-15: qty 200

## 2019-07-15 MED ORDER — ASPIRIN-DIPYRIDAMOLE ER 25-200 MG PO CP12
1.0000 | ORAL_CAPSULE | Freq: Two times a day (BID) | ORAL | Status: DC
  Administered 2019-07-16 – 2019-07-17 (×3): 1 via ORAL
  Filled 2019-07-15 (×5): qty 1

## 2019-07-15 MED ORDER — ACETAMINOPHEN 325 MG PO TABS: 650.0000 mg | ORAL_TABLET | Freq: Four times a day (QID) | ORAL | Status: DC | PRN

## 2019-07-15 MED ORDER — ZINC SULFATE 220 (50 ZN) MG PO CAPS
220.0000 mg | ORAL_CAPSULE | Freq: Every day | ORAL | Status: DC
  Administered 2019-07-15 – 2019-07-17 (×3): 220 mg via ORAL
  Filled 2019-07-15 (×2): qty 1

## 2019-07-15 MED ORDER — SODIUM CHLORIDE 0.9% FLUSH: 3.0000 mL | Freq: Once | INTRAVENOUS | Status: DC

## 2019-07-15 MED ORDER — GUAIFENESIN-DM 100-10 MG/5ML PO SYRP: 10.0000 mL | ORAL_SOLUTION | ORAL | 0 refills | Status: DC | PRN

## 2019-07-15 MED ORDER — ONDANSETRON HCL 4 MG/2ML IJ SOLN: 4.0000 mg | Freq: Four times a day (QID) | INTRAMUSCULAR | Status: DC | PRN

## 2019-07-15 MED ORDER — ONDANSETRON HCL 4 MG PO TABS: 4.0000 mg | ORAL_TABLET | Freq: Four times a day (QID) | ORAL | Status: DC | PRN

## 2019-07-15 MED ORDER — DEXAMETHASONE SODIUM PHOSPHATE 10 MG/ML IJ SOLN
10.0000 mg | Freq: Once | INTRAMUSCULAR | Status: AC
  Administered 2019-07-15: 10 mg via INTRAVENOUS
  Filled 2019-07-15: qty 1

## 2019-07-15 MED ORDER — DEXAMETHASONE 6 MG PO TABS
6.0000 mg | ORAL_TABLET | Freq: Every day | ORAL | Status: DC
  Administered 2019-07-16 – 2019-07-17 (×2): 6 mg via ORAL
  Filled 2019-07-15 (×2): qty 1

## 2019-07-15 MED ORDER — ASPIRIN-DIPYRIDAMOLE ER 25-200 MG PO CP12: 1.0000 | ORAL_CAPSULE | Freq: Two times a day (BID) | ORAL | Status: DC

## 2019-07-15 MED ORDER — SODIUM CHLORIDE 0.9 % IV SOLN: INTRAVENOUS | Status: DC

## 2019-07-15 MED ORDER — SIMVASTATIN 20 MG PO TABS
40.0000 mg | ORAL_TABLET | Freq: Every day | ORAL | Status: DC
  Administered 2019-07-16 – 2019-07-17 (×2): 40 mg via ORAL
  Filled 2019-07-15 (×2): qty 2

## 2019-07-15 MED ORDER — ZINC SULFATE 220 (50 ZN) MG PO CAPS: 220.0000 mg | ORAL_CAPSULE | Freq: Every day | ORAL | 0 refills | Status: DC

## 2019-07-15 MED ORDER — LACTATED RINGERS IV SOLN: INTRAVENOUS | Status: DC

## 2019-07-15 MED ORDER — ASCORBIC ACID 500 MG PO TABS: 500.0000 mg | ORAL_TABLET | Freq: Every day | ORAL | 0 refills | Status: DC

## 2019-07-15 MED ORDER — CALCIUM CARBONATE 1500 (600 CA) MG PO TABS: 1500.0000 mg | ORAL_TABLET | Freq: Two times a day (BID) | ORAL | Status: DC

## 2019-07-15 MED ORDER — INFLUENZA VAC A&B SA ADJ QUAD 0.5 ML IM PRSY
0.5000 mL | PREFILLED_SYRINGE | INTRAMUSCULAR | Status: AC
  Administered 2019-07-17: 0.5 mL via INTRAMUSCULAR
  Filled 2019-07-15: qty 0.5

## 2019-07-15 MED ORDER — IPRATROPIUM-ALBUTEROL 20-100 MCG/ACT IN AERS
1.0000 | INHALATION_SPRAY | Freq: Four times a day (QID) | RESPIRATORY_TRACT | Status: DC
  Administered 2019-07-15 – 2019-07-17 (×6): 1 via RESPIRATORY_TRACT
  Filled 2019-07-15: qty 4

## 2019-07-15 MED ORDER — HEPARIN SODIUM (PORCINE) 5000 UNIT/ML IJ SOLN
5000.0000 [IU] | Freq: Two times a day (BID) | INTRAMUSCULAR | Status: DC
  Administered 2019-07-15 – 2019-07-17 (×4): 5000 [IU] via SUBCUTANEOUS
  Filled 2019-07-15 (×4): qty 1

## 2019-07-15 MED ORDER — ZINC SULFATE 220 (50 ZN) MG PO CAPS: 220.0000 mg | ORAL_CAPSULE | Freq: Every day | ORAL | Status: DC

## 2019-07-15 MED ORDER — ASCORBIC ACID 500 MG PO TABS: 500.0000 mg | ORAL_TABLET | Freq: Every day | ORAL | Status: DC

## 2019-07-15 MED ORDER — SODIUM CHLORIDE 0.9 % IV SOLN
100.0000 mg | Freq: Every day | INTRAVENOUS | Status: DC
  Administered 2019-07-16 – 2019-07-17 (×2): 100 mg via INTRAVENOUS
  Filled 2019-07-15 (×2): qty 20

## 2019-07-15 MED ORDER — SIMVASTATIN 10 MG PO TABS: 40.0000 mg | ORAL_TABLET | Freq: Every day | ORAL | Status: DC

## 2019-07-15 NOTE — H&P (Signed)
TRH H&P   Patient Demographics:    Carolyn Kline, is a 86 y.o. female  MRN: [id number]   DOB - Jan 25, 1929  Admit Date - 07/15/2019  Outpatient Primary MD for the patient is Patient, No Pcp Per  Referring MD: Dr. Kerman Passey  Outpatient Specialists: Ophthalmology  Patient coming from: Home  Chief Complaint  Patient presents with  . Altered Mental Status  . Diarrhea      HPI:    Carolyn Kline  is a 86 y.o. female, with history of stroke, hypertension, dyslipidemia, chronic kidney disease stage III?  A, vascular/senile dementia who lives with her grandson was brought to the ED by her daughter with generalized weakness and watery diarrhea for past 4-5 days.  Patient's grandson was tested positive for COVID-19 about 1 week back.  Patient got tested 4 days back for POC and was negative.  Since that time she has been feeling increasingly weak, having cough with whitish phlegm, short of breath on minimal exertion and having watery diarrhea, 3-4 times a day.  Also reports increased urinary frequency.  Has very poor p.o. intake since then.  Daughter denies any fevers, headache, chills, dizziness, syncope, nausea, vomiting, chest pain, palpitations, abdominal pain, dysuria, falls, tingling or numbness of extremity.  No recent change in medication.  She was supposed to get an eye surgery this week but was postponed due to concern for COVID-19 infection.  Course in the ED Patient was tachycardic to 114, tachypneic with O2 sat at 90% and appeared to have some labored breathing.  Afebrile with stable blood pressure.  Blood work showed normal CBC.  Chemistry showed worsened renal function with creatinine of 3 (baseline around 1.5-1.7).  POC test for SARS-COV-2 was positive.  Chest x-ray showed left basilar opacity.  Patient ordered for IV Decadron and remdesivir and hospitalist consulted for  admission.  On discussion with patient and her daughter they agree on her being transferred to [place] for further care.   Review of systems:    In addition to the HPI above, (limited as patient unable to provide detailed history and obtained from the daughter) No Fever-chills, No Headache, No changes with Vision or hearing, No problems swallowing food or Liquids, No chest pain, cough with whitish phlegm +, shortness of breath + + No Abdominal pain, No Nausea or vomiting, diarrhea + + + No Blood in stool or Urine, No dysuria, increased urinary frequency No new skin rashes or bruises, No joint pain Generalized weakness, tingling, numbness in any extremity, No recent weight gain or loss, No polyuria, polydypsia or polyphagia, No significant Mental Stressors.  A full 10 point Review of Systems was done, except as stated above, all other Review of Systems were negative.   With Past History of the following :    Past Medical History:  Diagnosis Date  . CVA (cerebral infarction)   .  Hyperlipemia   . Hypertension       History reviewed. No pertinent surgical history.    Social History:     Social History   Tobacco Use  . Smoking status: Current Every Day Smoker  Substance Use Topics  . Alcohol use: No     Lives -Home with grandson  Mobility -mostly independent     Family History :   No family history of heart disease or diabetes   Home Medications:   Prior to Admission medications   Medication Sig Start Date End Date Taking? Authorizing Provider  calcium carbonate (OSCAL) 1500 (600 CA) MG TABS tablet Take by mouth 2 (two) times daily with a meal.   Yes [provider]  dipyridamole-aspirin (AGGRENOX) 200-25 MG 12hr capsule Take 1 capsule by mouth 2 (two) times daily.   Yes [provider]  lisinopril (PRINIVIL,ZESTRIL) 40 MG tablet Take 40 mg by mouth daily.   Yes [provider]  simvastatin (ZOCOR) 40 MG tablet Take 40  mg by mouth daily.   Yes [provider]     Allergies:    No Known Allergies   Physical Exam:   Vitals  Blood pressure (!) 118/57, pulse 67, temperature 99.4 F (37.4 C), resp. rate 18, height 5\' 3"  (1.6 m), weight 49.9 kg, SpO2 100 %.   General: Elderly female lying in bed appears fatigued, in no acute distress HEENT: Pupils reactive bilaterally, EOMI, no pallor, no icterus, dry oral mucosa, supple neck, no cervical lymphadenopathy Chest: Fine left basilar crackles, no rhonchi or wheeze CVs: Normal S1-S2, no murmurs rub or gallop GI: Soft, nondistended, nontender, bowel sounds are Musculoskeletal: Warm, no edema CNS: Alert and awake, oriented to place and person    Data Review:    CBC Recent Labs  Lab 07/15/19 1514  WBC 6.5  HGB 13.6  HCT 41.8  PLT 173  MCV 102.5*  MCH 33.3  MCHC 32.5  RDW 14.0   ------------------------------------------------------------------------------------------------------------------  Chemistries  Recent Labs  Lab 07/15/19 1514  NA 140  K 4.5  CL 108  CO2 21*  GLUCOSE 133*  BUN 53*  CREATININE 3.03*  CALCIUM 8.5*   ------------------------------------------------------------------------------------------------------------------ estimated creatinine clearance is 10.1 mL/min (A) (by C-G formula based on SCr of 3.03 mg/dL (H)). ------------------------------------------------------------------------------------------------------------------ No results for input(s): TSH, T4TOTAL, T3FREE, THYROIDAB in the last 72 hours.  Invalid input(s): FREET3  Coagulation profile No results for input(s): INR, PROTIME in the last 168 hours. ------------------------------------------------------------------------------------------------------------------- No results for input(s): DDIMER in the last 72 hours. -------------------------------------------------------------------------------------------------------------------  Cardiac  Enzymes No results for input(s): CKMB, TROPONINI, MYOGLOBIN in the last 168 hours.  Invalid input(s): CK ------------------------------------------------------------------------------------------------------------------ No results found for: BNP   ---------------------------------------------------------------------------------------------------------------  Urinalysis    Component Value Date/Time   COLORURINE Yellow 05/28/2014 1720   APPEARANCEUR Cloudy 05/28/2014 1720   LABSPEC 1.013 05/28/2014 1720   PHURINE 5.0 05/28/2014 1720   GLUCOSEU Negative 05/28/2014 1720   HGBUR Negative 05/28/2014 1720   BILIRUBINUR Negative 05/28/2014 1720   KETONESUR Negative 05/28/2014 1720   PROTEINUR Negative 05/28/2014 1720   NITRITE Negative 05/28/2014 1720   LEUKOCYTESUR Trace 05/28/2014 1720    ----------------------------------------------------------------------------------------------------------------   Imaging Results:    DG Chest Portable 1 View  Result Date: 07/15/2019 CLINICAL DATA:  Altered mental status. EXAM: PORTABLE CHEST 1 VIEW COMPARISON:  07/26/2018 FINDINGS: Normal heart size. No pleural effusion or edema. No pleural effusion. Chronic interstitial coarsening noted bilaterally. Asymmetric opacity in the left base is noted which may represent  atelectasis or pneumonia. IMPRESSION: Left base opacity which may represent atelectasis or pneumonia. Electronically Signed   By: Kerby Moors M.D.   On: 07/15/2019 16:02    My personal review of EKG: Normal sinus rhythm at 93 with PACs and T wave inversion in anterior leads.  No significant change from prior EKG.  Assessment & Plan:    Active Problems:   Acute respiratory failure due to COVID-19 [facility]) Patient currently maintaining sats in the low 90s but was having tachypnea with O2 sat of 90% on room air on presentation.  Placed on 2 L via nasal cannula.  Airborne and contact precautions. Started on empiric IV Decadron and  remdesivir per pharmacy.  (Consent obtained verbally from patient's daughter at bedside.) Check 500 very D-dimer, CRP, ferritin, daily CBC, complete metabolic panel, magnesium and phosphorus.  Check LFTs and if normal patient should be a good candidate for receiving tocilizumab. Albuterol inhaler, antitussive as needed.  Added vitamin C and zinc.  Discussed with both patient and daughter and both agree on being transferred to [place] for further care.      History of stroke Continue dipyridamole and statin.    Essential hypertension Hold lisinopril given AKI.     Acute renal failure superimposed on stage 3a chronic kidney disease ([place]) Possibly prerenal with dehydration.    Diarrhea due to COVID-19 Had one episode this morning.  IV hydration.  Loperamide as needed.  No recent antibiotic use.  Left lobar pneumonia Secondary to COVID-19 infection.  Does not need antibiotic.  Check procalcitonin.   DVT Prophylaxis subcu Lovenox  AM Labs Ordered, also please review Full Orders  Family Communication: Admission, patients condition and plan of care including tests being ordered have been discussed with the patient and daughter at bedside  Code Status DNR (confirmed with patient with daughter at bedside).  Discharge to [place] called: None  Admission status: Inpatient It is my opinion that patient is presenting with acute respiratory failure associated with COVID-19 infection and acute kidney injury.  Patient is at high risk for further decompensation of her respiratory failure, septic shock secondary to COVID-19 infection and worsening of her renal failure.  Patient needs to be monitored in an inpatient setting with administration of IV steroid, IV remdesivir, and IV fluids for at least >2 midnight.  Time spent in minutes : 70   Tishawn Friedhoff M.D on 07/15/2019 at 5:25 PM  Between 7am to 7pm - Pager - [phone]. After  7pm go to [email or web address] - password [facility]  [facility] - Office  [phone]

## 2019-07-15 NOTE — Progress Notes (Signed)
Attempted to reach both daughters Carolyn Kline and Carolyn Kline for updates but no answer at both numbers.

## 2019-07-15 NOTE — Discharge Summary (Signed)
Please refer to admission H&P, labs, assessment and plan for details  In brief,  86 year old female with a history of stroke, hypertension, dyslipidemia, chronic renal disease status?  3a, presented with generalized weakness, watery diarrhea and poor p.o. intake for past 4-5 days.  Her grandson who lives with her was tested positive for COVID-19 last week. In the ED she had labored breathing with O2 sat of 90% on room air and tested positive for SARS-CoV-2 on POC.  Labs noted for acute on chronic kidney disease stage III.  Chest x-ray showing left basilar opacity.  Vitals, labs and imaging as per HPI.  Plan. As per HPI.  Patient will be transferred to [facility] for further management.  [facility] contacted for transfer.

## 2019-07-15 NOTE — Consult Note (Signed)
Remdesivir - Pharmacy Brief Note   O:  ALT: 18 CXR: Left base opacity which may represent atelectasis or pneumonia.  SpO2: 91% on RA   A/P:  Remdesivir 200 mg IVPB once followed by 100 mg IVPB daily x 4 days.   Pearla Dubonnet, PharmD Clinical Pharmacist 07/15/2019 6:40 PM

## 2019-07-15 NOTE — Progress Notes (Signed)
[place] for Remdesivir Indication: COVID-19 infection  No Known Allergies  Patient Measurements: Height: 5\' 3"  (160 cm) Weight: 103 lb 9.9 oz (47 kg) IBW/kg (Calculated) : 52.4  Vital Signs: Temp: 98.9 F (37.2 C) (02/18 1940) Temp Source: Oral (02/18 1940) BP: 154/61 (02/18 1940) Pulse Rate: 61 (02/18 1940)  Labs: Recent Labs    07/15/19 1514  WBC 6.5  HGB 13.6  HCT 41.8  PLT 173  CREATININE 3.03*  ALBUMIN 3.1*  PROT 7.5  AST 44*  ALT 18  ALKPHOS 63  BILITOT 0.4  BILIDIR <0.1  IBILI NOT CALCULATED   Estimated Creatinine Clearance: 9.5 mL/min (A) (by C-G formula based on SCr of 3.03 mg/dL (H)).   Microbiology: No results found for this or any previous visit (from the past 720 hour(s)).  Medical History: Past Medical History:  Diagnosis Date  . CVA (cerebral infarction)   . Hyperlipemia   . Hypertension      Assessment: Patient transferred from [place].   Remdesivir 200mg  IV administered at [facility] on 07/15/19 at 1816.  Plan:  Continue remdesivir 100mg  IV qday x 4 days starting tomorrow.  Efraim Kaufmann, PharmD, BCPS 07/15/2019,8:35 PM

## 2019-07-15 NOTE — Plan of Care (Signed)
  Problem: Education: Goal: Knowledge of risk factors and measures for prevention of condition will improve Outcome: Progressing   Problem: Coping: Goal: Psychosocial and spiritual needs will be supported Outcome: Progressing   Problem: Respiratory: Goal: Will maintain a patent airway Outcome: Progressing Goal: Complications related to the disease process, condition or treatment will be avoided or minimized Outcome: Progressing   

## 2019-07-15 NOTE — ED Notes (Signed)
Date and time results received: 07/15/19 1610  Test: Troponin Critical Value: 118  Name of Provider Notified: Kerman Passey, MD   Orders Received? Or Actions Taken?: None at this time

## 2019-07-15 NOTE — H&P (Signed)
TRH H&P   Patient Demographics:    Carolyn Kline, is a 86 y.o. female  MRN: [id number]   DOB - [birth year]  Admit Date - 07/15/2019  Outpatient Primary MD for the patient is Patient, No Pcp Per  Patient coming from: [facility]  No chief complaint on file.     HPI:    Carolyn Kline  is a 86 y.o. female, with prior CVA, HTN, HLD, CKD 3a week, vascular dementia who was evaluated by our colleagues at [facility] and subsequently transferred to [facility] for management of respiratory failure with hypoxia secondary to SARS-CoV-2.  Apparently one of his grandchildren tested positive for SARS-CoV-2 positive ago he developed generalized weakness, gastrointestinal symptoms including diarrhea about 4 days ago.  His initial point-of-care test was negative however he subsequently tested positive in the ER today.  He was also noted to have poor oral intake, chills, sweats.  No reported vomiting, hematemesis, hematochezia or melena.  Admits to cough productive of whitish phlegm, no hemoptysis.  No chest pain, palpitations, orthopnea or PND.    Review of systems:  Review of Systems:  Constitutional: negative for anorexia, chills, fatigue or fevers HEENT: negative for earaches, epistaxis, or sore throat Respiratory: see HPI Cardiovascular: negative for chest pain, palpitations, or syncope GU: negative for dysuria, urinary frequency, urinary urgency, hematuria Gastrointestinal: see HPI Musculoskeletal: negative for arthralgias, back pain or myalgias Neurological: negative for dizziness, headaches or weakness Behavioral/Psych: negative for suicidal or homicidal ideation Skin:negative for rash Heme: negative for bruises Endo: negative for hair loss,  weight gain/loss  With Past History of the following :   Past Medical History:  Diagnosis Date  . CVA (cerebral infarction)   . Hyperlipemia   . Hypertension       History reviewed. No pertinent surgical history.   Social History:     Social History   Tobacco Use  . Smoking status: Current Every Day Smoker  . Smokeless tobacco: Never Used  Substance Use Topics  . Alcohol use: No     Family History :    History reviewed. No pertinent family history.   Home Medications:   Prior to Admission medications   Medication Sig Start Date End Date Taking? Authorizing Provider  calcium carbonate (OSCAL) 1500 (600 CA) MG TABS tablet Take by mouth 2 (two) times daily with a meal.  Yes [provider]  dipyridamole-aspirin (AGGRENOX) 200-25 MG 12hr capsule Take 1 capsule by mouth 2 (two) times daily.   Yes [provider]  simvastatin (ZOCOR) 40 MG tablet Take 40 mg by mouth daily.   Yes [provider]  ascorbic acid (VITAMIN C) 500 MG tablet Take 1 tablet (500 mg total) by mouth daily. 07/15/19   Dhungel, Nishant, MD  chlorpheniramine-HYDROcodone (TUSSIONEX) 10-8 MG/5ML SUER Take 5 mLs by mouth every 12 (twelve) hours as needed for cough. 07/15/19   Dhungel, Nishant, MD  guaiFENesin-dextromethorphan (ROBITUSSIN DM) 100-10 MG/5ML syrup Take 10 mLs by mouth every 4 (four) hours as needed for cough. 07/15/19   Dhungel, Nishant, MD  zinc sulfate 220 (50 Zn) MG capsule Take 1 capsule (220 mg total) by mouth daily. 07/15/19   Dhungel, Flonnie Overman, MD    Allergies:    No Known Allergies   Physical Exam:   Vitals  Blood pressure (!) 154/61, pulse 61, temperature 98.9 F (37.2 C), temperature source Oral, resp. rate 20, height 5\' 3"  (1.6 m), weight 47 kg, SpO2 93 %.  Physical Exam   Constitutional - resting comfortably, no acute distress Eyes - pupils equal round and reactive to light and accomodation, extra ocular movements intact Nose - no gross deformity or  drainage Mouth - no oral lesions noted Throat - no swelling or erythema Neck - supple, no JVD   CV - (+)S1S2, no murmurs  Resp - Fine left basilar crackles, no rhonchi or wheeze  GI - (+)BS, soft, non-tender, non-distended Extrem - no clubbing, cyanosis, or peripheral edema  Skin - no rashes or wounds Neuro - alert, aware, oriented to person/place/time  Psych - normal affect, no anxiety   Patient has Pressure Ulcer on Admission?: no   Data Review:    CBC Recent Labs  Lab 07/15/19 1514  WBC 6.5  HGB 13.6  HCT 41.8  PLT 173  MCV 102.5*  MCH 33.3  MCHC 32.5  RDW 14.0   ------------------------------------------------------------------------------------------------------------------  Chemistries  Recent Labs  Lab 07/15/19 1514  NA 140  K 4.5  CL 108  CO2 21*  GLUCOSE 133*  BUN 53*  CREATININE 3.03*  CALCIUM 8.5*  AST 44*  ALT 18  ALKPHOS 63  BILITOT 0.4   ------------------------------------------------------------------------------------------------------------------ estimated creatinine clearance is 9.5 mL/min (A) (by C-G formula based on SCr of 3.03 mg/dL (H)). ------------------------------------------------------------------------------------------------------------------ No results for input(s): TSH, T4TOTAL, T3FREE, THYROIDAB in the last 72 hours.  Invalid input(s): FREET3  Coagulation profile No results for input(s): INR, PROTIME in the last 168 hours. ------------------------------------------------------------------------------------------------------------------- No results for input(s): DDIMER in the last 72 hours. -------------------------------------------------------------------------------------------------------------------  Cardiac Enzymes No results for input(s): CKMB, TROPONINI, MYOGLOBIN in the last 168 hours.  Invalid input(s):  CK ------------------------------------------------------------------------------------------------------------------ No results found for: BNP   ---------------------------------------------------------------------------------------------------------------  Urinalysis    Component Value Date/Time   COLORURINE Yellow 05/28/2014 1720   APPEARANCEUR Cloudy 05/28/2014 1720   LABSPEC 1.013 05/28/2014 1720   PHURINE 5.0 05/28/2014 1720   GLUCOSEU Negative 05/28/2014 1720   HGBUR Negative 05/28/2014 1720   BILIRUBINUR Negative 05/28/2014 1720   KETONESUR Negative 05/28/2014 1720   PROTEINUR Negative 05/28/2014 1720   NITRITE Negative 05/28/2014 1720   LEUKOCYTESUR Trace 05/28/2014 1720    ----------------------------------------------------------------------------------------------------------------   Imaging Results:    DG Chest Portable 1 View  Result Date: 07/15/2019 CLINICAL DATA:  Altered mental status. EXAM: PORTABLE CHEST 1 VIEW COMPARISON:  07/26/2018 FINDINGS: Normal heart size. No pleural effusion or edema. No pleural  effusion. Chronic interstitial coarsening noted bilaterally. Asymmetric opacity in the left base is noted which may represent atelectasis or pneumonia. IMPRESSION: Left base opacity which may represent atelectasis or pneumonia. Electronically Signed   By: Kerby Moors M.D.   On: 07/15/2019 16:02    Assessment & Plan:    Principal Problem:   Acute hypoxemic respiratory failure due to severe acute respiratory syndrome coronavirus 2 (SARS-CoV-2) disease (HCC) Active Problems:   Acute renal failure superimposed on stage 3a chronic kidney disease (HCC)   Diarrhea due to COVID-19   History of stroke   Essential hypertension      Acute hypoxemic respiratory failure due to SARS-CoV-2 disease/Diarrhea due to COVID-19: Patient presents as a transfer from  [facility] where he presented with shortness of breath, productive cough, was noted to be hypoxic supplemental  oxygen.  Admit patient for supportive care including antipyretics, antiemetics, antitussives, antidiarrheal medications if necessary. Date of Dx: 07/15/2019 Oxygen requirements: 2 LPM Antibiotics: Not indicated Diuretics: Clinically dehydrated from diarrhea but IV fluids Vitamin C and Zinc: Per protocol Remdesivir: Started on 2/17 Steroids: Started on 2/17 Actemra: Not given yet Convalescent Plasma: Not given yet    Acute renal failure superimposed on stage 3a CKD: Likely prerenal from diarrhea induced volume depletion.  Administer IV fluids overnight and recheck renal function in the morning.  Avoid nephrotoxic agents.    History of stroke: No acute neurological deficit apparent.  Continue Aggrenox and antihypertensive medications.    Essential hypertension: Blood pressure is at goal, continue to monitor.  DVT Prophylaxis Heparin   AM Labs Ordered, also please review Full Orders  Family Communication: Admission, patients condition and plan of care including tests being ordered have been discussed with the patient who indicate understanding and agree with the plan and Code Status.  Code Status DNAR  Likely DC to  Home  Condition GUARDED    Consults called: None    Admission status: Admit to inpatient    Time spent in minutes : 2   Peyton Bottoms M.D on 07/15/2019 at 8:35 PM  To page go to [email or web address] - password [facility]

## 2019-07-15 NOTE — ED Notes (Signed)
Admitting provider at bedside PT and pt's daughter verbalized understanding of need to go to [facility]

## 2019-07-15 NOTE — ED Triage Notes (Addendum)
Pt comes with daughter with c/o of AMS and diarrhea. Daughter states the patient has become weak and not eating. Pt's grandson recently dx with COVID and he leaves with pt.   Daughter also states she thinks she may have a UTI.  Pt is A*OX4. Pt denies any pain. Pt states weakness. Pt has noticeable labored breathing. Pt denies any SOB.

## 2019-07-15 NOTE — ED Provider Notes (Signed)
[facility] Emergency Department Provider Note  Time seen: 4:52 PM  I have reviewed the triage vital signs and the nursing notes.   HISTORY  Chief Complaint Altered Mental Status and Diarrhea   HPI Carolyn Kline is a 86 y.o. female with a past medical history of hypertension, hyperlipidemia, prior CVA, presents to the emergency department for generalized fatigue weakness decreased appetite.  According to the daughter for the past 4 days or so she has not been eating or drinking much of anything, has been extremely weak and refusing to get out of bed at times due to generalized fatigue and weakness.  Patient's grandson recently tested positive for coronavirus who lives at the home.  Patient denies any fever cough or shortness of breath however currently satting in the low 90s on room air.  Largely negative review of systems otherwise although the daughter states the patient has been urinating more frequently than normal.   Past Medical History:  Diagnosis Date  . CVA (cerebral infarction)   . Hyperlipemia   . Hypertension     There are no problems to display for this patient.   History reviewed. No pertinent surgical history.  Prior to Admission medications   Medication Sig Start Date End Date Taking? Authorizing Provider  albuterol (PROVENTIL HFA;VENTOLIN HFA) 108 (90 BASE) MCG/ACT inhaler Inhale 2 puffs into the lungs every 6 (six) hours as needed for wheezing or shortness of breath. Patient not taking: Reported on 03/25/2019 03/27/15   Orbie Pyo, MD  azithromycin (ZITHROMAX) 250 MG tablet Take 1 tablet (250 mg total) by mouth daily. Patient not taking: Reported on 03/25/2019 03/28/15   Orbie Pyo, MD  calcium carbonate (OSCAL) 1500 (600 CA) MG TABS tablet Take by mouth 2 (two) times daily with a meal.    [provider]  dipyridamole-aspirin (AGGRENOX) 200-25 MG 12hr capsule Take 1 capsule by mouth 2 (two) times daily.     [provider]  lisinopril (PRINIVIL,ZESTRIL) 40 MG tablet Take 40 mg by mouth daily.    [provider]  simvastatin (ZOCOR) 40 MG tablet Take 40 mg by mouth daily.    [provider]    No Known Allergies  No family history on file.  Social History Social History   Tobacco Use  . Smoking status: Current Every Day Smoker  Substance Use Topics  . Alcohol use: No  . Drug use: No    Review of Systems Constitutional: Negative for fever.  Positive for generalized weakness. Cardiovascular: Negative for chest pain. Respiratory: Negative for shortness of breath. Gastrointestinal: Negative for abdominal pain Genitourinary: Increased urinary frequency but denies dysuria. Musculoskeletal: Negative for musculoskeletal complaints Neurological: Negative for headache All other ROS negative  ____________________________________________   PHYSICAL EXAM:  VITAL SIGNS: ED Triage Vitals  Enc Vitals Group     BP 07/15/19 1505 (!) 113/58     Pulse Rate 07/15/19 1505 (!) 114     Resp 07/15/19 1505 18     Temp 07/15/19 1505 99.4 F (37.4 C)     Temp src --      SpO2 07/15/19 1505 91 %     Weight 07/15/19 1500 110 lb 0.2 oz (49.9 kg)     Height 07/15/19 1500 5\' 3"  (1.6 m)     Head Circumference --      Peak Flow --      Pain Score 07/15/19 1500 0     Pain Loc --      Pain  Edu? --      Excl. in [place]? --    Constitutional: Patient is awake and alert, no acute distress. Eyes: Normal exam ENT      Head: Normocephalic and atraumatic.      Mouth/Throat: Mucous membranes are moist. Cardiovascular: Normal rate, regular rhythm. Respiratory: Normal respiratory effort without tachypnea nor retractions. Breath sounds are clear  Gastrointestinal: Soft and nontender. No distention.  Musculoskeletal: Nontender with normal range of motion in all extremities.  Neurologic:  Normal speech and language. No gross focal neurologic deficits Skin:  Skin is warm, dry and  intact.  Psychiatric: Mood and affect are normal.   ____________________________________________    EKG  EKG viewed and interpreted by myself shows a sinus rhythm at 93 bpm with a narrow QRS, normal axis, normal intervals, nonspecific ST changes.  ____________________________________________    RADIOLOGY  Chest x-ray shows possible left basilar opacity.  ____________________________________________   INITIAL IMPRESSION / ASSESSMENT AND PLAN / ED COURSE  Pertinent labs & imaging results that were available during my care of the patient were reviewed by me and considered in my medical decision making (see chart for details).   Patient presents to the emergency department for generalized fatigue weakness decreased appetite decreased oral intake.  Differential would include infectious etiology, metabolic or electrolyte abnormality, dehydration.  We will check labs and continue to closely monitor.  Patient's x-ray shows possible pneumonia, Covid test is positive which would explain this.  Patient's troponin is 118.  No chest pain.  Given her Covid positive status generalized fatigue weakness borderline hypoxia at 91% and acute kidney injury we will admit to the hospital service for further treatment.  Carolyn Kline was evaluated in Emergency Department on 07/15/2019 for the symptoms described in the history of present illness. She was evaluated in the context of the global COVID-19 pandemic, which necessitated consideration that the patient might be at risk for infection with the SARS-CoV-2 virus that causes COVID-19. Institutional protocols and algorithms that pertain to the evaluation of patients at risk for COVID-19 are in a state of rapid change based on information released by regulatory bodies including the CDC and federal and state organizations. These policies and algorithms were followed during the patient's care in the ED.  ____________________________________________   FINAL  CLINICAL IMPRESSION(S) / ED DIAGNOSES  COVID-19 Acute kidney injury Dehydration Weakness   Harvest Dark, MD 07/15/19 1657

## 2019-07-16 DIAGNOSIS — J9601 Acute respiratory failure with hypoxia: Secondary | ICD-10-CM

## 2019-07-16 DIAGNOSIS — I10 Essential (primary) hypertension: Secondary | ICD-10-CM

## 2019-07-16 DIAGNOSIS — Z8673 Personal history of transient ischemic attack (TIA), and cerebral infarction without residual deficits: Secondary | ICD-10-CM

## 2019-07-16 DIAGNOSIS — A0839 Other viral enteritis: Secondary | ICD-10-CM

## 2019-07-16 DIAGNOSIS — U071 COVID-19: Principal | ICD-10-CM

## 2019-07-16 LAB — COMPREHENSIVE METABOLIC PANEL
ALT: 17 U/L (ref 0–44)
AST: 40 U/L (ref 15–41)
Albumin: 2.4 g/dL — ABNORMAL LOW (ref 3.5–5.0)
Alkaline Phosphatase: 48 U/L (ref 38–126)
Anion gap: 8 (ref 5–15)
BUN: 49 mg/dL — ABNORMAL HIGH (ref 8–23)
CO2: 20 mmol/L — ABNORMAL LOW (ref 22–32)
Calcium: 8.2 mg/dL — ABNORMAL LOW (ref 8.9–10.3)
Chloride: 113 mmol/L — ABNORMAL HIGH (ref 98–111)
Creatinine, Ser: 2.63 mg/dL — ABNORMAL HIGH (ref 0.44–1.00)
GFR calc Af Amer: 18 mL/min — ABNORMAL LOW (ref >60)
GFR calc non Af Amer: 16 mL/min — ABNORMAL LOW (ref >60)
Glucose, Bld: 136 mg/dL — ABNORMAL HIGH (ref 70–99)
Potassium: 5.5 mmol/L — ABNORMAL HIGH (ref 3.5–5.1)
Sodium: 141 mmol/L (ref 135–145)
Total Bilirubin: 0.2 mg/dL — ABNORMAL LOW (ref 0.3–1.2)
Total Protein: 5.7 g/dL — ABNORMAL LOW (ref 6.5–8.1)

## 2019-07-16 LAB — CBC WITH DIFFERENTIAL/PLATELET
Abs Immature Granulocytes: 0.03 10*3/uL (ref 0.00–0.07)
Basophils Absolute: 0 10*3/uL (ref 0.0–0.1)
Basophils Relative: 0 %
Eosinophils Absolute: 0 10*3/uL (ref 0.0–0.5)
Eosinophils Relative: 0 %
HCT: 36.5 % (ref 36.0–46.0)
Hemoglobin: 12 g/dL (ref 12.0–15.0)
Immature Granulocytes: 1 %
Lymphocytes Relative: 7 %
Lymphs Abs: 0.4 10*3/uL — ABNORMAL LOW (ref 0.7–4.0)
MCH: 34.3 pg — ABNORMAL HIGH (ref 26.0–34.0)
MCHC: 32.9 g/dL (ref 30.0–36.0)
MCV: 104.3 fL — ABNORMAL HIGH (ref 80.0–100.0)
Monocytes Absolute: 0.1 10*3/uL (ref 0.1–1.0)
Monocytes Relative: 2 %
Neutro Abs: 4.4 10*3/uL (ref 1.7–7.7)
Neutrophils Relative %: 90 %
Platelets: 144 10*3/uL — ABNORMAL LOW (ref 150–400)
RBC: 3.5 MIL/uL — ABNORMAL LOW (ref 3.87–5.11)
RDW: 14.2 % (ref 11.5–15.5)
WBC: 4.9 10*3/uL (ref 4.0–10.5)
nRBC: 0 % (ref 0.0–0.2)

## 2019-07-16 LAB — FERRITIN: Ferritin: 500 ng/mL — ABNORMAL HIGH (ref 11–307)

## 2019-07-16 LAB — C-REACTIVE PROTEIN: CRP: 11.2 mg/dL — ABNORMAL HIGH (ref <1.0)

## 2019-07-16 LAB — D-DIMER, QUANTITATIVE: D-Dimer, Quant: 11.65 ug/mL-FEU — ABNORMAL HIGH (ref 0.00–0.50)

## 2019-07-16 LAB — VITAMIN B12: Vitamin B-12: 162 pg/mL — ABNORMAL LOW (ref 180–914)

## 2019-07-16 LAB — FOLATE: Folate: 8.4 ng/mL (ref >5.9)

## 2019-07-16 MED ORDER — DEXTROSE-NACL 5-0.45 % IV SOLN: INTRAVENOUS | Status: AC

## 2019-07-16 NOTE — Progress Notes (Signed)
Spoke with patients daughter and provided an update

## 2019-07-16 NOTE — Progress Notes (Addendum)
PROGRESS NOTE    Carolyn Kline   YQM:[id number]  DOB: 07/29/1928  DOA: 07/15/2019 PCP: Patient, No Pcp Per   Brief Narrative:  Carolyn Kline  is a 86 y.o. female, with prior CVA, HTN, HLD, CKD 3a week, vascular dementia who was evaluated by our colleagues at [facility] and subsequently transferred to [facility] for management of respiratory failure with hypoxia secondary to SARS-CoV-2.  Apparently one of her grandchildren tested positive for SARS-CoV-2 positive ago she developed generalized weakness, gastrointestinal symptoms including diarrhea about 4 days ago.  CXR > Left basilar opacity   Significant events:   Subjective: No diarrhea today. No other complaints.     Assessment & Plan:   Principal Problem:   Acute hypoxemic respiratory failure due to COVID 19 - currently requiring 2 L O2 - Started on Decadron and Remdesivir - cont to wean O2 as able  Active Problems:      Diarrhea due to COVID-19   Acute renal failure superimposed on stage 3a chronic kidney disease  - cont IVF- follow amount of diarrhea and adjust fluid accordingly - Cr improved  Hyperkalemia - following  Elevated MCV - check B12/Folate levels  H/o CVA - cont Aggrenox and Zocor   Time spent in minutes: 35 DVT prophylaxis: Heparin Code Status: Full code Family Communication:  Disposition Plan: from home- cont in hospital until diarrhea resolved and AKI improved- will likely go back home Consultants:   none Procedures:   none Antimicrobials:  Anti-infectives (From admission, onward)   Start     Dose/Rate Route Frequency Ordered Stop   07/16/19 1000  remdesivir 100 mg in sodium chloride 0.9 % 100 mL IVPB     100 mg 200 mL/hr over 30 Minutes Intravenous Daily 07/15/19 2024 07/20/19 0959       Objective: Vitals:   07/16/19 0300 07/16/19 0400 07/16/19 0800 07/16/19 1200  BP:  118/62 136/67 137/66  Pulse: (!) 46 (!) 47 (!) 49 (!) 51  Resp:  19 20 (!) 21  Temp:  98.4 F (36.9 C)    TempSrc:   Oral    SpO2:  96% 97% 97%  Weight:      Height:        Intake/Output Summary (Last 24 hours) at 07/16/2019 1413 Last data filed at 07/16/2019 1300 Gross per 24 hour  Intake 219.92 ml  Output --  Net 219.92 ml   Filed Weights   07/15/19 1940  Weight: 47 kg    Examination: General exam: Appears comfortable  HEENT: PERRLA, oral mucosa moist, no sclera icterus or thrush Respiratory system: Clear to auscultation. Respiratory effort normal. Cardiovascular system: S1 & S2 heard, RRR.   Gastrointestinal system: Abdomen soft, non-tender, nondistended. Normal bowel sounds. Central nervous system: Alert and oriented. No focal neurological deficits. Extremities: No cyanosis, clubbing or edema Skin: No rashes or ulcers Psychiatry:  Mood & affect appropriate.     Data Reviewed: I have personally reviewed following labs and imaging studies  CBC: Recent Labs  Lab 07/15/19 1514 07/16/19 0252  WBC 6.5 4.9  NEUTROABS  --  4.4  HGB 13.6 12.0  HCT 41.8 36.5  MCV 102.5* 104.3*  PLT 173 528*   Basic Metabolic Panel: Recent Labs  Lab 07/15/19 1514 07/16/19 0252  NA 140 141  K 4.5 5.5*  CL 108 113*  CO2 21* 20*  GLUCOSE 133* 136*  BUN 53* 49*  CREATININE 3.03* 2.63*  CALCIUM 8.5* 8.2*   GFR: Estimated Creatinine Clearance: 11 mL/min (A) (by C-G  formula based on SCr of 2.63 mg/dL (H)). Liver Function Tests: Recent Labs  Lab 07/15/19 1514 07/16/19 0252  AST 44* 40  ALT 18 17  ALKPHOS 63 48  BILITOT 0.4 0.2*  PROT 7.5 5.7*  ALBUMIN 3.1* 2.4*   No results for input(s): LIPASE, AMYLASE in the last 168 hours. No results for input(s): AMMONIA in the last 168 hours. Coagulation Profile: No results for input(s): INR, PROTIME in the last 168 hours. Cardiac Enzymes: No results for input(s): CKTOTAL, CKMB, CKMBINDEX, TROPONINI in the last 168 hours. BNP (last 3 results) No results for input(s): PROBNP in the last 8760 hours. HbA1C: No results for input(s): HGBA1C in the  last 72 hours. CBG: No results for input(s): GLUCAP in the last 168 hours. Lipid Profile: No results for input(s): CHOL, HDL, LDLCALC, TRIG, CHOLHDL, LDLDIRECT in the last 72 hours. Thyroid Function Tests: No results for input(s): TSH, T4TOTAL, FREET4, T3FREE, THYROIDAB in the last 72 hours. Anemia Panel: Recent Labs    07/15/19 1514 07/16/19 0252  FERRITIN 533* 500*   Urine analysis:    Component Value Date/Time   COLORURINE Yellow 05/28/2014 1720   APPEARANCEUR Cloudy 05/28/2014 1720   LABSPEC 1.013 05/28/2014 1720   PHURINE 5.0 05/28/2014 1720   GLUCOSEU Negative 05/28/2014 1720   HGBUR Negative 05/28/2014 1720   BILIRUBINUR Negative 05/28/2014 1720   KETONESUR Negative 05/28/2014 1720   PROTEINUR Negative 05/28/2014 1720   NITRITE Negative 05/28/2014 1720   LEUKOCYTESUR Trace 05/28/2014 1720   Sepsis Labs: @LABRCNTIP (procalcitonin:4,lacticidven:4) )No results found for this or any previous visit (from the past 240 hour(s)).       Radiology Studies: DG Chest Portable 1 View  Result Date: 07/15/2019 CLINICAL DATA:  Altered mental status. EXAM: PORTABLE CHEST 1 VIEW COMPARISON:  07/26/2018 FINDINGS: Normal heart size. No pleural effusion or edema. No pleural effusion. Chronic interstitial coarsening noted bilaterally. Asymmetric opacity in the left base is noted which may represent atelectasis or pneumonia. IMPRESSION: Left base opacity which may represent atelectasis or pneumonia. Electronically Signed   By: Kerby Moors M.D.   On: 07/15/2019 16:02      Scheduled Meds: . vitamin C  500 mg Oral Daily  . dexamethasone  6 mg Oral Daily  . dipyridamole-aspirin  1 capsule Oral BID  . docusate sodium  100 mg Oral Daily  . heparin injection (subcutaneous)  5,000 Units Subcutaneous Q12H  . influenza vaccine adjuvanted  0.5 mL Intramuscular Tomorrow-1000  . Ipratropium-Albuterol  1 puff Inhalation Q6H  . simvastatin  40 mg Oral Daily  . zinc sulfate  220 mg Oral Daily    Continuous Infusions: . dextrose 5 % and 0.45% NaCl 75 mL/hr at 07/16/19 0903  . remdesivir 100 mg in NS 100 mL 100 mg (07/16/19 1107)     LOS: 1 day      Debbe Odea, MD [facility] Pager: [email or web address] 07/16/2019, 2:13 PM

## 2019-07-16 NOTE — Progress Notes (Signed)
Pt was speaking to grand-daughter on the phone and asked me to give her an update on her condition.  All questions answered.

## 2019-07-17 DIAGNOSIS — N1831 Chronic kidney disease, stage 3a: Secondary | ICD-10-CM

## 2019-07-17 DIAGNOSIS — N170 Acute kidney failure with tubular necrosis: Secondary | ICD-10-CM

## 2019-07-17 LAB — FERRITIN: Ferritin: 579 ng/mL — ABNORMAL HIGH (ref 11–307)

## 2019-07-17 LAB — CBC WITH DIFFERENTIAL/PLATELET
Abs Immature Granulocytes: 0.05 10*3/uL (ref 0.00–0.07)
Basophils Absolute: 0 10*3/uL (ref 0.0–0.1)
Basophils Relative: 0 %
Eosinophils Absolute: 0 10*3/uL (ref 0.0–0.5)
Eosinophils Relative: 0 %
HCT: 36.7 % (ref 36.0–46.0)
Hemoglobin: 11.9 g/dL — ABNORMAL LOW (ref 12.0–15.0)
Immature Granulocytes: 1 %
Lymphocytes Relative: 3 %
Lymphs Abs: 0.2 10*3/uL — ABNORMAL LOW (ref 0.7–4.0)
MCH: 33.3 pg (ref 26.0–34.0)
MCHC: 32.4 g/dL (ref 30.0–36.0)
MCV: 102.8 fL — ABNORMAL HIGH (ref 80.0–100.0)
Monocytes Absolute: 0.4 10*3/uL (ref 0.1–1.0)
Monocytes Relative: 5 %
Neutro Abs: 6.7 10*3/uL (ref 1.7–7.7)
Neutrophils Relative %: 91 %
Platelets: 166 10*3/uL (ref 150–400)
RBC: 3.57 MIL/uL — ABNORMAL LOW (ref 3.87–5.11)
RDW: 14 % (ref 11.5–15.5)
WBC: 7.4 10*3/uL (ref 4.0–10.5)
nRBC: 0 % (ref 0.0–0.2)

## 2019-07-17 LAB — COMPREHENSIVE METABOLIC PANEL
ALT: 19 U/L (ref 0–44)
AST: 40 U/L (ref 15–41)
Albumin: 2.4 g/dL — ABNORMAL LOW (ref 3.5–5.0)
Alkaline Phosphatase: 47 U/L (ref 38–126)
Anion gap: 11 (ref 5–15)
BUN: 64 mg/dL — ABNORMAL HIGH (ref 8–23)
CO2: 21 mmol/L — ABNORMAL LOW (ref 22–32)
Calcium: 8.3 mg/dL — ABNORMAL LOW (ref 8.9–10.3)
Chloride: 108 mmol/L (ref 98–111)
Creatinine, Ser: 2.04 mg/dL — ABNORMAL HIGH (ref 0.44–1.00)
GFR calc Af Amer: 25 mL/min — ABNORMAL LOW (ref >60)
GFR calc non Af Amer: 21 mL/min — ABNORMAL LOW (ref >60)
Glucose, Bld: 162 mg/dL — ABNORMAL HIGH (ref 70–99)
Potassium: 4.8 mmol/L (ref 3.5–5.1)
Sodium: 140 mmol/L (ref 135–145)
Total Bilirubin: 0.4 mg/dL (ref 0.3–1.2)
Total Protein: 5.8 g/dL — ABNORMAL LOW (ref 6.5–8.1)

## 2019-07-17 LAB — C-REACTIVE PROTEIN: CRP: 9.5 mg/dL — ABNORMAL HIGH (ref <1.0)

## 2019-07-17 LAB — D-DIMER, QUANTITATIVE: D-Dimer, Quant: 12.37 ug/mL-FEU — ABNORMAL HIGH (ref 0.00–0.50)

## 2019-07-17 MED ORDER — CYANOCOBALAMIN 1000 MCG/ML IJ SOLN
1000.0000 ug | Freq: Once | INTRAMUSCULAR | Status: AC
  Administered 2019-07-17: 1000 ug via SUBCUTANEOUS
  Filled 2019-07-17: qty 1

## 2019-07-17 MED ORDER — VITAMIN B-12 1000 MCG PO TABS: 1000.0000 ug | ORAL_TABLET | Freq: Every day | ORAL | 0 refills | Status: DC

## 2019-07-17 NOTE — Progress Notes (Signed)
SATURATION QUALIFICATIONS: (This note is used to comply with regulatory documentation for home oxygen)  Patient Saturations on Room Air at Rest = 93%  Patient Saturations on Room Air while Ambulating = 83%  Patient Saturations on 2 Liters of oxygen while Ambulating = 92%  Please briefly explain why patient needs home oxygen: Patient desaturates on ambulation and would benefit from supplemental oxygen.

## 2019-07-17 NOTE — Progress Notes (Signed)
Occupational Therapy Evaluation Patient Details Name: Jesslyn Viglione MRN: [id number] DOB: [birth year] Today's Date: 07/17/2019    History of Present Illness Hilma Steinhilber is a78 y.o.female,with prior CVA, HTN, HLD, CKD 3aweek, vascular dementia who was evaluated by our colleagues at [facility] and subsequently transferred to [facility] management of respiratory failure with hypoxia secondary to SARS-CoV-2. Apparently one of his grandchildren tested positive for SARS-CoV-2 positive ago he developed generalized weakness, gastrointestinal symptoms including diarrhea about 4 days ago.    Clinical Impression   Patient reports residing home alone with family members living in same area and checks on patient daily. Patient resides in a single story home with no AD. Patient reports family assist with IADL tasks and she mobilizes with no AD. Patient was able to complete all self-care tasks with Independence at [facility]. Overall patient requires set-up to Max A for self-care tasks with incontinence issues for voiding. Patient mobilized around room and to bathroom with RW and experienced 1 LOB episode. Patient reports no history of falls at home. Patient's O2 stats were at high 80s -90s during OT evaluation on room air. Patient educated and given a demonstration on pursed lip breathing techniques.  Patient will benefit from continued acute OT services.     Follow Up Recommendations  Home health OT;Supervision/Assistance - 24 hour    Equipment Recommendations  3 in 1 bedside commode    Recommendations for Other Services       Precautions / Restrictions Precautions Precautions: Fall Restrictions Weight Bearing Restrictions: No      Mobility Bed Mobility                  Transfers Overall transfer level: Needs assistance Equipment used: Rolling walker (2 wheeled) Transfers: Sit to/from Stand;Stand Pivot Transfers Sit to Stand: Min guard Stand pivot transfers: Min guard            Balance  Overall balance assessment: Needs assistance   Sitting balance-Leahy Scale: Good     Standing balance support: Bilateral upper extremity supported Standing balance-Leahy Scale: Fair                             ADL either performed or assessed with clinical judgement   ADL Overall ADL's : Needs assistance/impaired Eating/Feeding: Set up   Grooming: Wash/dry face;Wash/dry hands;Oral care;Set up   Upper Body Bathing: Set up   Lower Body Bathing: Minimal assistance   Upper Body Dressing : Set up   Lower Body Dressing: Minimal assistance   Toilet Transfer: Minimal assistance   Toileting- Clothing Manipulation and Hygiene: Minimal assistance       Functional mobility during ADLs: Minimal assistance;Rolling walker;Cueing for safety General ADL Comments: Patient experience 1 LOB episode when mobilizing with ROLLING walker to bathroom.      Vision Baseline Vision/History: Wears glasses Wears Glasses: At all times       Perception     Praxis      Pertinent Vitals/Pain Pain Assessment: No/denies pain     Hand Dominance Right   Extremity/Trunk Assessment             Communication Communication Communication: No difficulties   Cognition Arousal/Alertness: Awake/alert Behavior During Therapy: WFL for tasks assessed/performed Overall Cognitive Status: History of cognitive impairments - at baseline  General Comments: A & O x 3    General Comments  88% on room air during OT eval    Exercises     Shoulder Instructions      Home Living Family/patient expects to be discharged to:: Private residence Living Arrangements: Alone(Children live close by per patient ) Available Help at Discharge: Family Type of Home: House Home Access: Stairs to enter Technical brewer of Steps: 4 Entrance Stairs-Rails: Right Home Layout: One level     Bathroom Shower/Tub: Tub/shower unit;Walk-in Management consultant: Standard     Home Equipment: Shower seat          Prior Functioning/Environment Level of Independence: Independent                 OT Problem List:        OT Treatment/Interventions:      OT Goals(Current goals can be found in the care plan section) Acute Rehab OT Goals Patient Stated Goal: to go home  OT Goal Formulation: With patient Time For Goal Achievement: 07/31/19 Potential to Achieve Goals: Good  OT Frequency:     Barriers to D/C:            Co-evaluation              AM-PAC OT "6 Clicks" Daily Activity     Outcome Measure Help from another person eating meals?: A Little Help from another person taking care of personal grooming?: A Little Help from another person toileting, which includes using toliet, bedpan, or urinal?: A Lot(incontinence episodes ) Help from another person bathing (including washing, rinsing, drying)?: A Little Help from another person to put on and taking off regular upper body clothing?: A Little Help from another person to put on and taking off regular lower body clothing?: A Little 6 Click Score: 17   End of Session Nurse Communication: Mobility status  Activity Tolerance: Patient tolerated treatment well Patient left: in chair;with chair alarm set;with call bell/phone within reach                   Time: 0828-0910 OT Time Calculation (min): 42 min Charges:  OT General Charges $OT Visit: 1 Visit OT Evaluation $OT Eval Moderate Complexity: 1 Mod OT Treatments $Self Care/Home Management : 8-22 mins $Therapeutic Activity: 8-22 mins  Medina Degraffenreid OTR/L   Meri Pelot 07/17/2019, 9:15 AM

## 2019-07-17 NOTE — Progress Notes (Signed)
PHYSICAL THERAPY EVALUATION  HISTORY OF ILLNESS: 86 y.o. female, with prior CVA, HTN, HLD, CKD 3a week, vascular dementia who was evaluated by our colleagues at [facility] and subsequently transferred to [facility] for management of respiratory failure with hypoxia secondary to SARS-CoV-2.  Apparently one of his grandchildren tested positive for SARS-CoV-2 positive ago he developed generalized weakness, gastrointestinal symptoms including diarrhea about 4 days ago.   CLINICAL IMPRESSION: Pt admitted with above hx and above dx. PTA was living home with grandson, states that grandson did not help her with ADLs. She also states that she was able to get around without the use of and AD, even though she owns a walker. This morning pt has soiled sheets and self and room has strong scent of urine. She is moving quite slowly needing repetated cues and instructions to get moving. Initially declined use of walker but immediately lost her balance upon standing. Even with walker noted multi loses of balance, needing min guard assist to correct. Ambulated 2 x 258ft with RW and SBA/min guard assist (w/ LOBs), Pt was on room air throughout and min accurate desat noted was 89%. Pt will benefit from continued PT tx while in hospital to address deficits in strength, balance and coordination, independence, activity tolerance and safety with mobility. At d/c she may return home but she will require increased supervision and assistance with mobilizing and also HHPT. Should family not be able to provide increased supervision/asssit SNF placement may be needed at least temporarily to continue with rehabilitation.  D/C RECOMMENDATION: HHPT and supervision/assist with mobility/OOB.     07/17/19 0900  PT Visit Information  Last PT Received On 07/17/19  Assistance Needed +1  History of Present Illness 86 y.o. female, with prior CVA, HTN, HLD, CKD 3a week, vascular dementia who was evaluated by our colleagues at [facility] and subsequently  transferred to [facility] for management of respiratory failure with hypoxia secondary to SARS-CoV-2.  Apparently one of his grandchildren tested positive for SARS-CoV-2 positive ago he developed generalized weakness, gastrointestinal symptoms including diarrhea about 4 days ago.   Subjective Data  Patient Stated Goal to go home  Precautions  Precautions Fall  Restrictions  Weight Bearing Restrictions No  Pain Assessment  Pain Assessment No/denies pain  Cognition  Arousal/Alertness Awake/alert  Behavior During Therapy Flat affect  Overall Cognitive Status History of cognitive impairments - at baseline  General Comments Pt seems to have some slow processing and needing increased cues, verbal and tactile, to complete tasks. Pt is also extreemely incontinent of urine  Bed Mobility  General bed mobility comments pt was sitting in recliner at therapist arrival   Transfers  Overall transfer level Needs assistance  Equipment used Rolling walker (2 wheeled)  Transfers Sit to/from Stand  Sit to Stand Min guard  Ambulation/Gait  Ambulation/Gait assistance Min guard  Gait Distance (Feet) 200 Feet  Assistive device Rolling walker (2 wheeled)  Gait Pattern/deviations Step-through pattern;Wide base of support;Staggering left;Staggering right  General Gait Details balance with ambulation is greatly decreased pt lists left and right with ambulation, multi loses of balance noted during ambulation. She states at home she does not use assistive device. Pt ambulatex 2 x 231ft with min guard assist on room air. sats dropped initially to low 80s (but this was most probably an incorrect reading) on later attempt sats dropped to min of 89% and only at end of ambulation  Gait velocity fair  Balance  Overall balance assessment Needs assistance  Sitting-balance support Feet supported  Sitting balance-Leahy Scale Good  Standing balance support During functional activity;Bilateral upper extremity supported  Standing  balance-Leahy Scale Fair (fair- multi loses of balance )  Exercises  Exercises Other exercises  Other Exercises  Other Exercises flutter valve x 5 w/ cues  Other Exercises incentive spirometer x 5 w/ cues pulls max 287ml  PT - End of Session  Equipment Utilized During Treatment Gait belt  Activity Tolerance Patient limited by fatigue;Patient limited by lethargy  Patient left in chair;with call bell/phone within reach;with nursing/sitter in room  Nurse Communication Mobility status;Other (comment) (assessment results)   PT - Assessment/Plan  PT Visit Diagnosis Other abnormalities of gait and mobility (R26.89);Unsteadiness on feet (R26.81);Muscle weakness (generalized) (M62.81)  PT Frequency (ACUTE ONLY) Min 3X/week  Follow Up Recommendations Home health PT;Supervision for mobility/OOB  PT equipment  (states has walker)  AM-PAC PT "6 Clicks" Mobility Outcome Measure (Version 2)  Help needed turning from your back to your side while in a flat bed without using bedrails? 4  Help needed moving from lying on your back to sitting on the side of a flat bed without using bedrails? 3  Help needed moving to and from a bed to a chair (including a wheelchair)? 3  Help needed standing up from a chair using your arms (e.g., wheelchair or bedside chair)? 3  Help needed to walk in hospital room? 3  Help needed climbing 3-5 steps with a railing?  3  6 Click Score 19  Consider Recommendation of Discharge To: Home with [facility]  Acute Rehab PT Goals  PT Goal Formulation Patient unable to participate in goal setting  Time For Goal Achievement 07/31/19  Potential to Achieve Goals Fair  PT Time Calculation  PT Start Time (ACUTE ONLY) 0934  PT Stop Time (ACUTE ONLY) 1000  PT Time Calculation (min) (ACUTE ONLY) 26 min  PT General Charges  $$ ACUTE PT VISIT 1 Visit  PT Evaluation  $PT Eval Moderate Complexity 1 Mod  PT Treatments  $Gait Training 8-22 mins    Horald Chestnut, PT

## 2019-07-17 NOTE — Progress Notes (Signed)
Patient scheduled for outpatient Remdesivir infusion at 10:00 AM on Sunday 2/21 and Monday 2/22.  Please advise them to report to [facility] at [address]  Drive to the security guard and tell them you are here for an infusion. They will direct you to the front entrance where we will come and get you.  For questions call [phone].  Thanks

## 2019-07-17 NOTE — Progress Notes (Signed)
Patient and daughter Carolyn Kline were educated on discharge information and on oxygen usage and they state understanding.

## 2019-07-17 NOTE — Discharge Instructions (Addendum)
You are scheduled for an outpatient infusion of Remdesivir at 10:00 AM on Sunday 2/21 and Monday 2/22.  Please report to [facility] at [address].  Drive to the security guard and tell them you are here for an infusion. They will direct you to the front entrance where we will come and get you.  For questions call [phone].  Thanks          Remember to drink at least 6-8 glasses of water per day.   You were cared for by a hospitalist during your hospital stay. If you have any questions about your discharge medications or the care you received while you were in the hospital after you are discharged, you can call the unit and asked to speak with the hospitalist on call if the hospitalist that took care of you is not available. Once you are discharged, your primary care physician will handle any further medical issues.   Please note that NO REFILLS for any discharge medications will be authorized once you are discharged, as it is imperative that you return to your primary care physician (or establish a relationship with a primary care physician if you do not have one) for your aftercare needs so that they can reassess your need for medications and monitor your lab values.  Please take all your medications with you for your next visit with your Primary MD. Please ask your Primary MD to get all Hospital records sent to his/her office. Please request your Primary MD to go over all hospital test results at the follow up.   If you experience worsening of your admission symptoms, develop shortness of breath, chest pain, suicidal or homicidal thoughts or a life threatening emergency, you must seek medical attention immediately by calling 911 or calling your MD.   Dennis Bast must read the complete instructions/literature along with all the possible adverse reactions/side effects for all the medicines you take including new medications that have been prescribed to you. Take new medicines after  you have completely understood and accpet all the possible adverse reactions/side effects.    Do not drive when taking pain medications or sedatives.     Do not take more than prescribed Pain, Sleep and Anxiety Medications   If you have smoked or chewed Tobacco in the last 2 yrs please stop. Stop any regular alcohol  and or recreational drug use.   Wear Seat belts while driving.

## 2019-07-17 NOTE — TOC Transition Note (Addendum)
Transition of Care [facility]) - CM/SW Discharge Note   Patient Details  Name: Carolyn Kline MRN: [id number] Date of Birth: [birth year]  [facility] CM/SW Contact:  Bartholomew Crews, RN Phone Number: [phone] 07/17/2019, 11:16 AM   Clinical Narrative:    Patient to transition home today. States her daughter, Carolyn Kline, will pick her up. She states that Pam has talked to the nurse about her discharge plans.  Discussed returning to clinic on Sunday and Monday for her infusions, and also to follow up with her PCP, Dr. Reece Levy, at [facility]. Patient verbalized understanding. Discussed recommendations for [facility] PT, OT. Patient agreed. Offered choice of agencies. HH orders for PT and OT requested. Referral placed to [facility]. No further TOC needs identified at this time.   Update: HH orders placed for PT, OT, RN, Aide. [facility] notified of additional services ordered. No aide services available at this time. Advised that nursing is needed for blood draw and clinical assessment.   Update: [facility] accepted referral for PT, OT, RN with start of care for Wednesday. NCM spoke with both daughters, Carolyn Kline and Carolyn Kline. Discussed infusions on Sunday and Monday; [facility] RN to draw blood in 5 days to send to PCP. Also advised by nursing that patient oxygen drops when she exerts. Referral placed to [employer], order placed by MD, and [facility] notified for delivery of portable concentrator to the room. Spoke with Pam about oxygen need - Pam stated that patient is a smoker - discussed that patient should not smoke when oxygen is in use and could cause the cigarette to ignite. Pam verbalized understanding. Pam stated that patient was scheduled for her first covid vaccine on Monday - advised to f/u with PCP before getting this vaccine.    Final next level of care: [place] Barriers to Discharge: No Barriers Identified   Patient Goals and CMS Choice Patient states their goals for this  hospitalization and ongoing recovery are:: return home CMS Medicare.gov Compare Post Acute Care list provided to:: Patient Choice offered to / list presented to : Patient  Discharge Placement                       Discharge Plan and Services In-house Referral: NA Discharge Planning Services: CM Consult Post Acute Care Choice: Home Health          DME Arranged: N/A DME Agency: NA       HH Arranged: PT, OT [place] Agency: [facility] (formerly [employer]) Date [place]: 07/17/19 Time [place]: 1115 Representative spoke with at [employer]: [place] ([place] Interventions     Readmission Risk Interventions No flowsheet data found.

## 2019-07-17 NOTE — Discharge Summary (Signed)
Physician Discharge Summary  Carolyn Kline JSE:[id number] DOB: [birth year] DOA: 07/15/2019  PCP: Patient, No Pcp Per  Admit date: 07/15/2019 Discharge date: 07/17/2019  Admitted From: home Disposition:  home   Recommendations for Outpatient Follow-up:  1. F/u Bmet in 1 wk  Home Health:  none  Discharge Condition:  stable CODE STATUS:  Full code   Diet recommendation:  Heart healthy Consultations:  none  Procedures/Studies: . none   Discharge Diagnoses:  Principal Problem:   Acute hypoxemic respiratory failure due to severe acute respiratory syndrome coronavirus 2 (SARS-CoV-2) disease (HCC) Active Problems:   Acute renal failure superimposed on stage 3a chronic kidney disease ([place])   Diarrhea due to COVID-19   History of stroke   Essential hypertension   Brief Summary: Carolyn Kline is a31 y.o.female,with prior CVA, HTN, HLD, CKD 3aweek, vascular dementia who was evaluated by our colleagues at [facility] and subsequently transferred to [facility] management of respiratory failure with hypoxia secondary to SARS-CoV-2. Apparently one of her grandchildren tested positive for SARS-CoV-2 positive ago she developed generalized weakness, gastrointestinal symptoms including diarrhea about 4 days ago.   CXR > Left basilar opacity  Hospital Course:  Principal Problem:   Acute hypoxemic respiratory failure due to COVID 19 - initially requiring 2 L O2 but subsequently weaned off - CXR showed a mild infiltrate- she has no cough or dypsnea.  - COVID markers are down trending - Started on Decadron and Remdesivir in the hospital    Active Problems:      Diarrhea due to COVID-19   Acute renal failure superimposed on stage 3a chronic kidney disease  - no further diarrhea noted after hospitalization- she was receiving IVF but no longer needs this as she is drinking fluids quite well now.  - Cr improved from 3.03 on admission to 2.04 today (which may be her baseline) - when BUN/Cr was last  documented in Epic, it was in 2016 and BUN was 18 and Cr 1.69-  - as she no longer requires IVF, I will d/c her from the hospital- she will need a f/u Bmet in 1 wk at her PCP's office  Hyperkalemia - resolved after IVF and is 4.8 today  Elevated MCV- B12 deficiency - checked B12 level which is found to be low (at 162)  - she has received a s/c injection today and will need daily oral B12 at home - Folate level also checked and found to be normal  H/o CVA - cont Aggrenox and Zocor   Discharge Exam: Vitals:   07/17/19 0600 07/17/19 0724  BP:  108/65  Pulse: (!) 56   Resp: (!) 24   Temp:  99 F (37.2 C)  SpO2: 93%    Vitals:   07/17/19 0400 07/17/19 0500 07/17/19 0600 07/17/19 0724  BP: 139/70   108/65  Pulse: (!) 53 (!) 55 (!) 56   Resp: 13 (!) 21 (!) 24   Temp: 98.8 F (37.1 C)   99 F (37.2 C)  TempSrc: Oral   Oral  SpO2: 99% 93% 93%   Weight:      Height:        General: Pt is alert, awake, not in acute distress Cardiovascular: RRR, S1/S2 +, no rubs, no gallops Respiratory: CTA bilaterally, no wheezing, no rhonchi Abdominal: Soft, NT, ND, bowel sounds + Extremities: no edema, no cyanosis   Discharge Instructions  Discharge Instructions    Diet - low sodium heart healthy   Complete by: As directed    Increase  activity slowly   Complete by: As directed      Allergies as of 07/17/2019   No Known Allergies     Medication List    TAKE these medications   ascorbic acid 500 MG tablet Commonly known as: VITAMIN C Take 1 tablet (500 mg total) by mouth daily.   calcium carbonate 1500 (600 Ca) MG Tabs tablet Commonly known as: OSCAL Take by mouth 2 (two) times daily with a meal.   chlorpheniramine-HYDROcodone 10-8 MG/5ML Suer Commonly known as: TUSSIONEX Take 5 mLs by mouth every 12 (twelve) hours as needed for cough.   dipyridamole-aspirin 200-25 MG 12hr capsule Commonly known as: AGGRENOX Take 1 capsule by mouth 2 (two) times daily.    guaiFENesin-dextromethorphan 100-10 MG/5ML syrup Commonly known as: ROBITUSSIN DM Take 10 mLs by mouth every 4 (four) hours as needed for cough.   simvastatin 40 MG tablet Commonly known as: ZOCOR Take 40 mg by mouth daily.   vitamin B-12 1000 MCG tablet Commonly known as: CYANOCOBALAMIN Take 1 tablet (1,000 mcg total) by mouth daily.   zinc sulfate 220 (50 Zn) MG capsule Take 1 capsule (220 mg total) by mouth daily.       No Known Allergies    DG Chest Portable 1 View  Result Date: 07/15/2019 CLINICAL DATA:  Altered mental status. EXAM: PORTABLE CHEST 1 VIEW COMPARISON:  07/26/2018 FINDINGS: Normal heart size. No pleural effusion or edema. No pleural effusion. Chronic interstitial coarsening noted bilaterally. Asymmetric opacity in the left base is noted which may represent atelectasis or pneumonia. IMPRESSION: Left base opacity which may represent atelectasis or pneumonia. Electronically Signed   By: Kerby Moors M.D.   On: 07/15/2019 16:02     The results of significant diagnostics from this hospitalization (including imaging, microbiology, ancillary and laboratory) are listed below for reference.     Microbiology: No results found for this or any previous visit (from the past 240 hour(s)).   Labs: BNP (last 3 results) No results for input(s): BNP in the last 8760 hours. Basic Metabolic Panel: Recent Labs  Lab 07/15/19 1514 07/16/19 0252 07/17/19 0049  NA 140 141 140  K 4.5 5.5* 4.8  CL 108 113* 108  CO2 21* 20* 21*  GLUCOSE 133* 136* 162*  BUN 53* 49* 64*  CREATININE 3.03* 2.63* 2.04*  CALCIUM 8.5* 8.2* 8.3*   Liver Function Tests: Recent Labs  Lab 07/15/19 1514 07/16/19 0252 07/17/19 0049  AST 44* 40 40  ALT 18 17 19   ALKPHOS 63 48 47  BILITOT 0.4 0.2* 0.4  PROT 7.5 5.7* 5.8*  ALBUMIN 3.1* 2.4* 2.4*   No results for input(s): LIPASE, AMYLASE in the last 168 hours. No results for input(s): AMMONIA in the last 168 hours. CBC: Recent Labs  Lab  07/15/19 1514 07/16/19 0252 07/17/19 0049  WBC 6.5 4.9 7.4  NEUTROABS  --  4.4 6.7  HGB 13.6 12.0 11.9*  HCT 41.8 36.5 36.7  MCV 102.5* 104.3* 102.8*  PLT 173 144* 166   Cardiac Enzymes: No results for input(s): CKTOTAL, CKMB, CKMBINDEX, TROPONINI in the last 168 hours. BNP: Invalid input(s): POCBNP CBG: No results for input(s): GLUCAP in the last 168 hours. D-Dimer Recent Labs    07/16/19 0252 07/17/19 0049  DDIMER 11.65* 12.37*   Hgb A1c No results for input(s): HGBA1C in the last 72 hours. Lipid Profile No results for input(s): CHOL, HDL, LDLCALC, TRIG, CHOLHDL, LDLDIRECT in the last 72 hours. Thyroid function studies No results for input(s): TSH,  T4TOTAL, T3FREE, THYROIDAB in the last 72 hours.  Invalid input(s): FREET3 Anemia work up Recent Labs    07/16/19 0252 07/16/19 1434 07/17/19 0049  VITAMINB12  --  162*  --   FOLATE  --  8.4  --   FERRITIN 500*  --  579*   Urinalysis    Component Value Date/Time   COLORURINE Yellow 05/28/2014 1720   APPEARANCEUR Cloudy 05/28/2014 1720   LABSPEC 1.013 05/28/2014 1720   PHURINE 5.0 05/28/2014 1720   GLUCOSEU Negative 05/28/2014 1720   HGBUR Negative 05/28/2014 1720   BILIRUBINUR Negative 05/28/2014 1720   KETONESUR Negative 05/28/2014 1720   PROTEINUR Negative 05/28/2014 1720   NITRITE Negative 05/28/2014 1720   LEUKOCYTESUR Trace 05/28/2014 1720   Sepsis Labs Invalid input(s): PROCALCITONIN,  WBC,  LACTICIDVEN Microbiology No results found for this or any previous visit (from the past 240 hour(s)).   Time coordinating discharge in minutes: 50  SIGNED:   Debbe Odea, MD  [facility] 07/17/2019, 9:33 AM

## 2019-07-18 ENCOUNTER — Ambulatory Visit (HOSPITAL_COMMUNITY)
Admission: RE | Admit: 2019-07-18 | Discharge: 2019-07-18 | Disposition: A | Discharge status: Home / Self Care | Payer: Medicare HMO | Source: Ambulatory Visit | Attending: Pulmonary Disease | Admitting: Pulmonary Disease

## 2019-07-18 ENCOUNTER — Encounter (HOSPITAL_COMMUNITY): Payer: Self-pay

## 2019-07-18 DIAGNOSIS — U071 COVID-19: Secondary | ICD-10-CM | POA: Insufficient documentation

## 2019-07-18 DIAGNOSIS — J1282 Pneumonia due to coronavirus disease 2019: Secondary | ICD-10-CM | POA: Insufficient documentation

## 2019-07-18 MED ORDER — SODIUM CHLORIDE 0.9 % IV SOLN: INTRAVENOUS | Status: DC | PRN

## 2019-07-18 MED ORDER — SODIUM CHLORIDE 0.9 % IV SOLN
100.0000 mg | Freq: Once | INTRAVENOUS | Status: AC
  Administered 2019-07-18: 100 mg via INTRAVENOUS
  Filled 2019-07-18: qty 20

## 2019-07-18 MED ORDER — ALBUTEROL SULFATE HFA 108 (90 BASE) MCG/ACT IN AERS: 2.0000 | INHALATION_SPRAY | Freq: Once | RESPIRATORY_TRACT | Status: DC | PRN

## 2019-07-18 MED ORDER — FAMOTIDINE IN NACL 20-0.9 MG/50ML-% IV SOLN: 20.0000 mg | Freq: Once | INTRAVENOUS | Status: DC | PRN

## 2019-07-18 MED ORDER — METHYLPREDNISOLONE SODIUM SUCC 125 MG IJ SOLR: 125.0000 mg | Freq: Once | INTRAMUSCULAR | Status: DC | PRN

## 2019-07-18 MED ORDER — EPINEPHRINE 0.3 MG/0.3ML IJ SOAJ: 0.3000 mg | Freq: Once | INTRAMUSCULAR | Status: DC | PRN

## 2019-07-18 MED ORDER — DIPHENHYDRAMINE HCL 50 MG/ML IJ SOLN: 50.0000 mg | Freq: Once | INTRAMUSCULAR | Status: DC | PRN

## 2019-07-18 NOTE — Progress Notes (Signed)
  Diagnosis: COVID-19  Physician: Dr. Joya Gaskins  Procedure: [facility] Med: remdesivir infusion.  Complications: No immediate complications noted.  Discharge: Discharged home   Carolyn Kline 07/18/2019

## 2019-07-19 ENCOUNTER — Ambulatory Visit (HOSPITAL_COMMUNITY)
Admit: 2019-07-19 | Discharge: 2019-07-19 | Disposition: A | Discharge status: Home / Self Care | Payer: Medicare HMO | Attending: Pulmonary Disease | Admitting: Pulmonary Disease

## 2019-07-19 DIAGNOSIS — U071 COVID-19: Secondary | ICD-10-CM | POA: Diagnosis not present

## 2019-07-19 MED ORDER — SODIUM CHLORIDE 0.9 % IV SOLN
100.0000 mg | Freq: Once | INTRAVENOUS | Status: AC
  Administered 2019-07-19: 12:00:00 100 mg via INTRAVENOUS
  Filled 2019-07-19: qty 20

## 2019-07-19 MED ORDER — EPINEPHRINE 0.3 MG/0.3ML IJ SOAJ: 0.3000 mg | Freq: Once | INTRAMUSCULAR | Status: DC | PRN

## 2019-07-19 MED ORDER — DIPHENHYDRAMINE HCL 50 MG/ML IJ SOLN: 50.0000 mg | Freq: Once | INTRAMUSCULAR | Status: DC | PRN

## 2019-07-19 MED ORDER — METHYLPREDNISOLONE SODIUM SUCC 125 MG IJ SOLR: 125.0000 mg | Freq: Once | INTRAMUSCULAR | Status: DC | PRN

## 2019-07-19 MED ORDER — SODIUM CHLORIDE 0.9 % IV SOLN: INTRAVENOUS | Status: DC | PRN

## 2019-07-19 MED ORDER — ALBUTEROL SULFATE HFA 108 (90 BASE) MCG/ACT IN AERS: 2.0000 | INHALATION_SPRAY | Freq: Once | RESPIRATORY_TRACT | Status: DC | PRN

## 2019-07-19 MED ORDER — FAMOTIDINE IN NACL 20-0.9 MG/50ML-% IV SOLN: 20.0000 mg | Freq: Once | INTRAVENOUS | Status: DC | PRN

## 2019-07-19 NOTE — Progress Notes (Signed)
  Diagnosis: COVID-19  Physician: Dr. Joya Gaskins  Procedure: [facility] Infusion Clinic Med: remdesivir infusion.  Complications: No immediate complications noted.  Discharge: Discharged home   Carolyn Kline 07/19/2019

## 2019-08-20 ENCOUNTER — Other Ambulatory Visit: Payer: Self-pay

## 2020-01-26 HISTORY — PX: EYE SURGERY: SHX253

## 2020-02-23 ENCOUNTER — Telehealth: Payer: Self-pay | Admitting: *Deleted

## 2020-02-23 NOTE — Telephone Encounter (Signed)
Will do once pt is scheduled for her appts. I have her added to our list so we will get her scheduled soon.

## 2020-02-23 NOTE — Telephone Encounter (Signed)
Daughter, Carolyn Kline contacted me regarding status of lung screening scan appt. Have attempted to contact PCP office for several days with a voicemail and multiple hold times in excess of 20 minutes, to explain that patient is not eligible for lung screening scan due to age > 54. However, given findings on prior imaging patient should be eligible for CT scan in our pulmonary nodule clinic. Information has been shared with nodule clinic and this note will be forwarded as well. Daughter knows to contact me in the future if needed.

## 2020-03-07 ENCOUNTER — Other Ambulatory Visit: Payer: Self-pay | Admitting: Infectious Diseases

## 2020-03-07 DIAGNOSIS — R634 Abnormal weight loss: Secondary | ICD-10-CM

## 2020-03-07 DIAGNOSIS — Z720 Tobacco use: Secondary | ICD-10-CM

## 2020-03-07 DIAGNOSIS — R053 Chronic cough: Secondary | ICD-10-CM

## 2020-03-17 ENCOUNTER — Other Ambulatory Visit: Payer: Self-pay | Admitting: Oncology

## 2020-03-17 DIAGNOSIS — R9389 Abnormal findings on diagnostic imaging of other specified body structures: Secondary | ICD-10-CM

## 2020-03-17 NOTE — Progress Notes (Signed)
  Pulmonary Nodule Clinic Telephone Note [place]   Received referral from Carolyn Kline.  [name] unfortunately no longer meets criteria given he is over the age of 87.  HPI: Carolyn Kline is a 86 year old female with past medical history significant for hypertension, acute renal failure and a history of a stroke who was referred by Dr. Ola Kline her primary care provider after reviewing a recent chest x-ray that showed a left base opacity which likely represented atelectasis or pneumonia.   Review and Recommendations: I personally reviewed all patient's previous imaging including most recent chest x-ray.  I recommend follow-up with noncontrast chest CT in the next 1 to 2 weeks.  Social History: Patient is a former smoker.    Tobacco Use: High Risk  . Smoking Tobacco Use: Current Every Day Smoker  . Smokeless Tobacco Use: Never Used    High risk factors include: History of heavy smoking, exposure to asbestos, radium or uranium, personal family history of lung cancer, older age, sex (females greater than males), race (black and native [place] greater than weight), marginal speculation, upper lobe location, multiplicity (less than 5 nodules increases risk for malignancy) and emphysema and/or pulmonary fibrosis.   This recommendation follows the consensus statement: Guidelines for Management of Incidental Pulmonary Nodules Detected on CT Images: From the Fleischner Society 2017; Radiology 2017; 284:228-243.    I have placed order for CT scan without contrast to be completed in 1 to 2 weeks.  Disposition: Order placed for repeat CT chest. Will notify Carolyn Kline in scheduling. Carolyn Kline to call patient with appointment date and time. Return to pulmonary nodule clinic a few days after his repeat imaging to discuss results and plan moving forward.  Carolyn Casa, NP 03/17/2020 4:03 PM

## 2020-03-20 ENCOUNTER — Ambulatory Visit
Admission: RE | Admit: 2020-03-20 | Discharge: 2020-03-20 | Disposition: A | Discharge status: Home / Self Care | Payer: Medicare HMO | Source: Ambulatory Visit | Attending: Infectious Diseases | Admitting: Infectious Diseases

## 2020-03-20 ENCOUNTER — Other Ambulatory Visit: Payer: Self-pay

## 2020-03-20 DIAGNOSIS — R634 Abnormal weight loss: Secondary | ICD-10-CM

## 2020-03-20 DIAGNOSIS — R053 Chronic cough: Secondary | ICD-10-CM | POA: Diagnosis present

## 2020-03-20 DIAGNOSIS — Z720 Tobacco use: Secondary | ICD-10-CM

## 2020-06-29 ENCOUNTER — Other Ambulatory Visit: Payer: Self-pay | Admitting: Oncology

## 2020-06-29 NOTE — Progress Notes (Signed)
Schedule CT scan in 12 months from previous.  Faythe Casa, NP 06/29/2020 2:14 PM

## 2020-08-01 DIAGNOSIS — J449 Chronic obstructive pulmonary disease, unspecified: Secondary | ICD-10-CM | POA: Diagnosis not present

## 2020-08-01 DIAGNOSIS — Z8673 Personal history of transient ischemic attack (TIA), and cerebral infarction without residual deficits: Secondary | ICD-10-CM | POA: Diagnosis not present

## 2020-08-01 DIAGNOSIS — I129 Hypertensive chronic kidney disease with stage 1 through stage 4 chronic kidney disease, or unspecified chronic kidney disease: Secondary | ICD-10-CM | POA: Diagnosis not present

## 2020-08-01 DIAGNOSIS — E785 Hyperlipidemia, unspecified: Secondary | ICD-10-CM | POA: Diagnosis not present

## 2020-08-01 DIAGNOSIS — N183 Chronic kidney disease, stage 3 unspecified: Secondary | ICD-10-CM | POA: Diagnosis not present

## 2020-08-01 DIAGNOSIS — Z0000 Encounter for general adult medical examination without abnormal findings: Secondary | ICD-10-CM | POA: Diagnosis not present

## 2020-08-14 DIAGNOSIS — N390 Urinary tract infection, site not specified: Secondary | ICD-10-CM | POA: Diagnosis not present

## 2020-08-14 DIAGNOSIS — K5900 Constipation, unspecified: Secondary | ICD-10-CM | POA: Diagnosis not present

## 2020-08-14 DIAGNOSIS — R1030 Lower abdominal pain, unspecified: Secondary | ICD-10-CM | POA: Diagnosis not present

## 2020-08-14 DIAGNOSIS — N184 Chronic kidney disease, stage 4 (severe): Secondary | ICD-10-CM | POA: Diagnosis not present

## 2020-09-04 DIAGNOSIS — N8111 Cystocele, midline: Secondary | ICD-10-CM | POA: Diagnosis not present

## 2020-09-04 DIAGNOSIS — Z4689 Encounter for fitting and adjustment of other specified devices: Secondary | ICD-10-CM | POA: Diagnosis not present

## 2020-10-09 ENCOUNTER — Ambulatory Visit: Payer: Self-pay | Admitting: Urology

## 2020-12-05 DIAGNOSIS — M79641 Pain in right hand: Secondary | ICD-10-CM | POA: Diagnosis not present

## 2020-12-05 DIAGNOSIS — J449 Chronic obstructive pulmonary disease, unspecified: Secondary | ICD-10-CM | POA: Diagnosis not present

## 2020-12-05 DIAGNOSIS — N184 Chronic kidney disease, stage 4 (severe): Secondary | ICD-10-CM | POA: Diagnosis not present

## 2020-12-05 DIAGNOSIS — E785 Hyperlipidemia, unspecified: Secondary | ICD-10-CM | POA: Diagnosis not present

## 2020-12-05 DIAGNOSIS — M79642 Pain in left hand: Secondary | ICD-10-CM | POA: Diagnosis not present

## 2020-12-06 ENCOUNTER — Other Ambulatory Visit (HOSPITAL_COMMUNITY): Payer: Self-pay | Admitting: Infectious Diseases

## 2020-12-06 ENCOUNTER — Other Ambulatory Visit: Payer: Self-pay | Admitting: Infectious Diseases

## 2020-12-06 DIAGNOSIS — R1319 Other dysphagia: Secondary | ICD-10-CM

## 2020-12-06 DIAGNOSIS — E785 Hyperlipidemia, unspecified: Secondary | ICD-10-CM

## 2020-12-06 DIAGNOSIS — N184 Chronic kidney disease, stage 4 (severe): Secondary | ICD-10-CM

## 2020-12-12 ENCOUNTER — Other Ambulatory Visit: Payer: Self-pay

## 2020-12-12 ENCOUNTER — Ambulatory Visit
Admission: RE | Admit: 2020-12-12 | Discharge: 2020-12-12 | Disposition: A | Discharge status: Home / Self Care | Payer: Medicare HMO | Source: Ambulatory Visit | Attending: Infectious Diseases | Admitting: Infectious Diseases

## 2020-12-12 DIAGNOSIS — E785 Hyperlipidemia, unspecified: Secondary | ICD-10-CM

## 2020-12-12 DIAGNOSIS — R1319 Other dysphagia: Secondary | ICD-10-CM | POA: Diagnosis not present

## 2020-12-12 DIAGNOSIS — R1310 Dysphagia, unspecified: Secondary | ICD-10-CM | POA: Diagnosis not present

## 2020-12-12 DIAGNOSIS — N184 Chronic kidney disease, stage 4 (severe): Secondary | ICD-10-CM | POA: Diagnosis not present

## 2020-12-12 DIAGNOSIS — K224 Dyskinesia of esophagus: Secondary | ICD-10-CM | POA: Diagnosis not present

## 2020-12-25 DIAGNOSIS — K222 Esophageal obstruction: Secondary | ICD-10-CM | POA: Diagnosis not present

## 2020-12-25 DIAGNOSIS — R634 Abnormal weight loss: Secondary | ICD-10-CM | POA: Diagnosis not present

## 2020-12-25 DIAGNOSIS — R768 Other specified abnormal immunological findings in serum: Secondary | ICD-10-CM | POA: Diagnosis not present

## 2020-12-25 DIAGNOSIS — J449 Chronic obstructive pulmonary disease, unspecified: Secondary | ICD-10-CM | POA: Diagnosis not present

## 2020-12-25 DIAGNOSIS — R053 Chronic cough: Secondary | ICD-10-CM | POA: Diagnosis not present

## 2020-12-25 DIAGNOSIS — R1319 Other dysphagia: Secondary | ICD-10-CM | POA: Diagnosis not present

## 2020-12-26 DIAGNOSIS — R1319 Other dysphagia: Secondary | ICD-10-CM | POA: Diagnosis not present

## 2020-12-26 DIAGNOSIS — R053 Chronic cough: Secondary | ICD-10-CM | POA: Diagnosis not present

## 2020-12-26 DIAGNOSIS — F17200 Nicotine dependence, unspecified, uncomplicated: Secondary | ICD-10-CM | POA: Diagnosis not present

## 2020-12-26 DIAGNOSIS — R634 Abnormal weight loss: Secondary | ICD-10-CM | POA: Diagnosis not present

## 2020-12-26 DIAGNOSIS — R6881 Early satiety: Secondary | ICD-10-CM | POA: Diagnosis not present

## 2020-12-26 DIAGNOSIS — R933 Abnormal findings on diagnostic imaging of other parts of digestive tract: Secondary | ICD-10-CM | POA: Diagnosis not present

## 2020-12-26 DIAGNOSIS — R630 Anorexia: Secondary | ICD-10-CM | POA: Diagnosis not present

## 2021-01-04 DIAGNOSIS — N814 Uterovaginal prolapse, unspecified: Secondary | ICD-10-CM | POA: Diagnosis not present

## 2021-01-04 DIAGNOSIS — N8111 Cystocele, midline: Secondary | ICD-10-CM | POA: Diagnosis not present

## 2021-01-04 DIAGNOSIS — R102 Pelvic and perineal pain: Secondary | ICD-10-CM | POA: Diagnosis not present

## 2021-01-09 ENCOUNTER — Other Ambulatory Visit: Payer: Self-pay

## 2021-01-09 ENCOUNTER — Emergency Department
Admission: EM | Admit: 2021-01-09 | Discharge: 2021-01-09 | Disposition: A | Discharge status: Home / Self Care | Payer: Medicare HMO | Attending: Emergency Medicine | Admitting: Emergency Medicine

## 2021-01-09 ENCOUNTER — Emergency Department: Payer: Medicare HMO

## 2021-01-09 DIAGNOSIS — I129 Hypertensive chronic kidney disease with stage 1 through stage 4 chronic kidney disease, or unspecified chronic kidney disease: Secondary | ICD-10-CM | POA: Insufficient documentation

## 2021-01-09 DIAGNOSIS — R102 Pelvic and perineal pain: Secondary | ICD-10-CM

## 2021-01-09 DIAGNOSIS — N1831 Chronic kidney disease, stage 3a: Secondary | ICD-10-CM | POA: Insufficient documentation

## 2021-01-09 DIAGNOSIS — N200 Calculus of kidney: Secondary | ICD-10-CM | POA: Diagnosis not present

## 2021-01-09 DIAGNOSIS — N281 Cyst of kidney, acquired: Secondary | ICD-10-CM | POA: Diagnosis not present

## 2021-01-09 DIAGNOSIS — Z8616 Personal history of COVID-19: Secondary | ICD-10-CM | POA: Diagnosis not present

## 2021-01-09 DIAGNOSIS — I714 Abdominal aortic aneurysm, without rupture: Secondary | ICD-10-CM | POA: Diagnosis not present

## 2021-01-09 DIAGNOSIS — K5730 Diverticulosis of large intestine without perforation or abscess without bleeding: Secondary | ICD-10-CM | POA: Diagnosis not present

## 2021-01-09 LAB — HEPATIC FUNCTION PANEL
ALT: 13 U/L (ref 0–44)
AST: 15 U/L (ref 15–41)
Albumin: 3.1 g/dL — ABNORMAL LOW (ref 3.5–5.0)
Alkaline Phosphatase: 60 U/L (ref 38–126)
Bilirubin, Direct: <0.1 mg/dL (ref 0.0–0.2)
Total Bilirubin: 0.6 mg/dL (ref 0.3–1.2)
Total Protein: 7.3 g/dL (ref 6.5–8.1)

## 2021-01-09 LAB — BASIC METABOLIC PANEL
Anion gap: 11 (ref 5–15)
BUN: 30 mg/dL — ABNORMAL HIGH (ref 8–23)
CO2: 23 mmol/L (ref 22–32)
Calcium: 9.1 mg/dL (ref 8.9–10.3)
Chloride: 103 mmol/L (ref 98–111)
Creatinine, Ser: 1.78 mg/dL — ABNORMAL HIGH (ref 0.44–1.00)
GFR, Estimated: 27 mL/min — ABNORMAL LOW (ref >60)
Glucose, Bld: 131 mg/dL — ABNORMAL HIGH (ref 70–99)
Potassium: 4.5 mmol/L (ref 3.5–5.1)
Sodium: 137 mmol/L (ref 135–145)

## 2021-01-09 LAB — URINALYSIS, COMPLETE (UACMP) WITH MICROSCOPIC
Bacteria, UA: NONE SEEN
Bilirubin Urine: NEGATIVE
Glucose, UA: NEGATIVE mg/dL
Hgb urine dipstick: NEGATIVE
Ketones, ur: NEGATIVE mg/dL
Nitrite: NEGATIVE
Protein, ur: 30 mg/dL — AB
Specific Gravity, Urine: 1.017 (ref 1.005–1.030)
pH: 5 (ref 5.0–8.0)

## 2021-01-09 LAB — CBC WITH DIFFERENTIAL/PLATELET
Abs Immature Granulocytes: 0.04 10*3/uL (ref 0.00–0.07)
Basophils Absolute: 0 10*3/uL (ref 0.0–0.1)
Basophils Relative: 1 %
Eosinophils Absolute: 0.1 10*3/uL (ref 0.0–0.5)
Eosinophils Relative: 1 %
HCT: 36.7 % (ref 36.0–46.0)
Hemoglobin: 12 g/dL (ref 12.0–15.0)
Immature Granulocytes: 1 %
Lymphocytes Relative: 10 %
Lymphs Abs: 0.8 10*3/uL (ref 0.7–4.0)
MCH: 33.5 pg (ref 26.0–34.0)
MCHC: 32.7 g/dL (ref 30.0–36.0)
MCV: 102.5 fL — ABNORMAL HIGH (ref 80.0–100.0)
Monocytes Absolute: 0.8 10*3/uL (ref 0.1–1.0)
Monocytes Relative: 11 %
Neutro Abs: 6.1 10*3/uL (ref 1.7–7.7)
Neutrophils Relative %: 76 %
Platelets: 246 10*3/uL (ref 150–400)
RBC: 3.58 MIL/uL — ABNORMAL LOW (ref 3.87–5.11)
RDW: 14.5 % (ref 11.5–15.5)
WBC: 7.9 10*3/uL (ref 4.0–10.5)
nRBC: 0 % (ref 0.0–0.2)

## 2021-01-09 MED ORDER — CEFDINIR 300 MG PO CAPS: 300.0000 mg | ORAL_CAPSULE | Freq: Every day | ORAL | 0 refills | Status: AC

## 2021-01-09 MED ORDER — CEFDINIR 300 MG PO CAPS
300.0000 mg | ORAL_CAPSULE | Freq: Once | ORAL | Status: AC
  Administered 2021-01-09: 300 mg via ORAL
  Filled 2021-01-09 (×2): qty 1

## 2021-01-09 NOTE — ED Notes (Signed)
See triage note, pt reports lower abd/pelvic pain that started last week after having pelvic ring removed. States did not hurt before placed or while placed, just after removed. States was placed d/t urine incontinence.  Denies n/v/d NAd noted. Call bell in reach. Stretcher locked in lowest position

## 2021-01-09 NOTE — ED Provider Notes (Signed)
[facility] Emergency Department Provider Note  __________________________________   Event Date/Time   First MD Initiated Contact with Patient 01/09/21 1805     (approximate)  I have reviewed the triage vital signs and the nursing notes.   HISTORY  Chief Complaint Pelvic Pain    HPI Carolyn Kline is a 87 y.o. female who reports about 1 week of suprapubic pain.  Patient has seen OB/GYN who evaluated her and reportedly did an ultrasound which found nothing.  Patient does have bladder prolapse and had a pessary which was removed.  This did not seem to help the pain.  Patient has also had about 20 pounds of weight loss since having COVID.  She has seen GI who diagnosed a lower esophageal possible stricture she is going to have an EGD done on the 18th in 2 days.  Patient comes here because she still having lower abdominal pain.  On exam she has pain diffusely throughout her abdomen especially in the right and left upper quadrants.  She is not having a fever.  She reports she has early satiety and food is somewhat difficult to swallow at times.  As noted by GI her liquids go down okay.         Past Medical History:  Diagnosis Date   CVA (cerebral infarction)    Hyperlipemia    Hypertension     Patient Active Problem List   Diagnosis Date Noted   Acute respiratory failure due to COVID-19 ([place]) 07/15/2019   History of stroke 07/15/2019   Essential hypertension 07/15/2019   Acute renal failure superimposed on stage 3a chronic kidney disease ([place]) 07/15/2019   Diarrhea due to COVID-19 07/15/2019   Acute hypoxemic respiratory failure due to severe acute respiratory syndrome coronavirus 2 (SARS-CoV-2) disease ([place]) 07/15/2019    No past surgical history on file.  Prior to Admission medications   Medication Sig Start Date End Date Taking? Authorizing Provider  cefdinir (OMNICEF) 300 MG capsule Take 1 capsule (300 mg total) by mouth daily for 5 days.  01/09/21 01/14/21 Yes Naaman Plummer, MD  ascorbic acid (VITAMIN C) 500 MG tablet Take 1 tablet (500 mg total) by mouth daily. 07/15/19   Dhungel, Flonnie Overman, MD  calcium carbonate (OSCAL) 1500 (600 CA) MG TABS tablet Take by mouth 2 (two) times daily with a meal.    [provider]  chlorpheniramine-HYDROcodone (TUSSIONEX) 10-8 MG/5ML SUER Take 5 mLs by mouth every 12 (twelve) hours as needed for cough. 07/15/19   Dhungel, Nishant, MD  dipyridamole-aspirin (AGGRENOX) 200-25 MG 12hr capsule Take 1 capsule by mouth 2 (two) times daily.    [provider]  guaiFENesin-dextromethorphan (ROBITUSSIN DM) 100-10 MG/5ML syrup Take 10 mLs by mouth every 4 (four) hours as needed for cough. 07/15/19   Dhungel, Flonnie Overman, MD  simvastatin (ZOCOR) 40 MG tablet Take 40 mg by mouth daily.    [provider]  vitamin B-12 (CYANOCOBALAMIN) 1000 MCG tablet Take 1 tablet (1,000 mcg total) by mouth daily. 07/17/19   Debbe Odea, MD  zinc sulfate 220 (50 Zn) MG capsule Take 1 capsule (220 mg total) by mouth daily. 07/15/19   Dhungel, Flonnie Overman, MD    Allergies Patient has no known allergies.  No family history on file.  Social History Social History   Tobacco Use   Smoking status: Every Day   Smokeless tobacco: Never  Substance Use Topics   Alcohol use: No   Drug use: No    Review of  Systems  Constitutional: No fever/chills Eyes: No visual changes. ENT: No sore throat. Cardiovascular: Denies chest pain. Respiratory: Denies shortness of breath. Gastrointestinal: See HPI for description of abdominal pain.  No nausea, no vomiting.  No diarrhea.  No constipation. Genitourinary: Negative for dysuria.  She reports urination every 4-5 hours. Musculoskeletal: Negative for back pain. Skin: Negative for rash. Neurological: Negative for headaches, focal weakness   ____________________________________________   PHYSICAL EXAM:  VITAL SIGNS: ED Triage Vitals  Enc Vitals Group     BP  01/09/21 1431 120/76     Pulse Rate 01/09/21 1431 88     Resp 01/09/21 1431 20     Temp 01/09/21 1431 98.4 F (36.9 C)     Temp Source 01/09/21 1431 Oral     SpO2 01/09/21 1431 100 %     Weight 01/09/21 1433 102 lb (46.3 kg)     Height 01/09/21 1433 5\' 5"  (1.651 m)     Head Circumference --      Peak Flow --      Pain Score 01/09/21 1431 6     Pain Loc --      Pain Edu? --      Excl. in [place]? --     Constitutional: Alert and oriented. Well appearing and in no acute distress. Eyes: Conjunctivae are normal.  Head: Atraumatic. Nose: No congestion/rhinnorhea. Mouth/Throat: Mucous membranes are moist.  Oropharynx non-erythematous. Neck: No stridor.  Cardiovascular: Normal rate, regular rhythm. Grossly normal heart sounds.  Good peripheral circulation. Respiratory: Normal respiratory effort.  No retractions. Lungs CTAB. Gastrointestinal: Soft to palpation in the right and left upper quadrant somewhat so the patient winces and jumps.  There is also some suprapubic tenderness.  No distention. No abdominal bruits. . Musculoskeletal: No lower extremity tenderness nor edema.  Neurologic:  Normal speech and language. No gross focal neurologic deficits are appreciated. Skin:  Skin is warm, dry and intact. No rash noted.   ____________________________________________   LABS (all labs ordered are listed, but only abnormal results are displayed)  Labs Reviewed  URINALYSIS, COMPLETE (UACMP) WITH MICROSCOPIC - Abnormal; Notable for the following components:      Result Value   Color, Urine YELLOW (*)    APPearance CLOUDY (*)    Protein, ur 30 (*)    Leukocytes,Ua LARGE (*)    All other components within normal limits  CBC WITH DIFFERENTIAL/PLATELET - Abnormal; Notable for the following components:   RBC 3.58 (*)    MCV 102.5 (*)    All other components within normal limits  BASIC METABOLIC PANEL - Abnormal; Notable for the following components:   Glucose, Bld 131 (*)    BUN 30 (*)     Creatinine, Ser 1.78 (*)    GFR, Estimated 27 (*)    All other components within normal limits  HEPATIC FUNCTION PANEL - Abnormal; Notable for the following components:   Albumin 3.1 (*)    All other components within normal limits  URINE CULTURE   ____________________________________________  EKG   ____________________________________________  RADIOLOGY Gertha Calkin, personally viewed and evaluated these images (plain radiographs) as part of my medical decision making, as well as reviewing the written report by the radiologist.  ED MD interpretation:    Official radiology report(s): CT ABDOMEN PELVIS WO CONTRAST  Result Date: 01/09/2021 CLINICAL DATA:  Lower abdominal pain. EXAM: CT ABDOMEN AND PELVIS WITHOUT CONTRAST TECHNIQUE: Multidetector CT imaging of the abdomen and pelvis was performed following the standard protocol  without IV contrast. COMPARISON:  None. FINDINGS: Lower chest: No acute abnormality. Hepatobiliary: No focal liver abnormality is seen. No gallstones, gallbladder wall thickening, or biliary dilatation. Pancreas: Unremarkable. No pancreatic ductal dilatation or surrounding inflammatory changes. Spleen: Normal in size without focal abnormality. Adrenals/Urinary Tract: Adrenal glands are unremarkable. Kidneys are normal in size, without obstructing renal calculi or hydronephrosis. A 4 mm nonobstructing renal stone is seen within the mid left kidney. 1.0 cm and 1.6 cm cysts are noted within the posterior aspect of the mid right kidney. There is mild to moderate severity distention of an otherwise unremarkable urinary bladder. Stomach/Bowel: Stomach is within normal limits. Appendix appears normal. No evidence of bowel wall thickening, distention, or inflammatory changes. Numerous diverticula are seen within the descending and sigmoid colon. Vascular/Lymphatic: There is diffuse calcification of the abdominal aorta and bilateral common iliac arteries with 4.6 cm diameter  aneurysmal dilatation of the infrarenal abdominal aorta. This measures approximately 10.0 cm in length. No enlarged abdominal or pelvic lymph nodes. Reproductive: Uterus and bilateral adnexa are unremarkable. Other: No abdominal wall hernia or abnormality. No abdominopelvic ascites. Musculoskeletal: Degenerative changes seen throughout the lumbar spine. IMPRESSION: 1. 4.6 cm diameter infrarenal abdominal aortic aneurysm. 2. Colonic diverticulosis. 3. 4 mm nonobstructing renal stone within the left kidney. Electronically Signed   By: Virgina Norfolk M.D.   On: 01/09/2021 19:17    ____________________________________________   PROCEDURES  Procedure(s) performed (including Critical Care):  Procedures   ____________________________________________   INITIAL IMPRESSION / ASSESSMENT AND PLAN / ED COURSE  Patient seen initially in the lobby and moved to room 2 at which time I signed the patient out to the other side doctor.             ____________________________________________   FINAL CLINICAL IMPRESSION(S) / ED DIAGNOSES  Final diagnoses:  Pelvic pain in female  Kidney stone     ED Discharge Orders          Ordered    cefdinir (OMNICEF) 300 MG capsule  Daily        01/09/21 2014             Note:  This document was prepared using Dragon voice recognition software and may include unintentional dictation errors.    Nena Polio, MD 01/09/21 [phone]

## 2021-01-09 NOTE — ED Triage Notes (Signed)
Pt reports that her lower pelvis pain for over a week, her PMD put in a pelvic ring (look like a button) in her uterus because she was her uterus was prolapsed. He took it out on 8/11 to see if it would help the pain go away. She denies any pain when she urinates.

## 2021-01-09 NOTE — ED Provider Notes (Signed)
Emergency department handoff note  Care of this patient was signed out to me by Dr. Rip Harbour after the patient was moved into one of my rooms.  Patient reportedly presents for pelvic pain for the last week after a pelvic ring was removed.  Patient was pending a CT scan of the abdomen that did not show any evidence of acute abnormalities however did show a intrarenal stone.  Possible that she may have had a kidney stone that has passed at this time given the leukocytes in her urine without any evidence of bacteria.  The patient's caretaker at bedside also requests a antibiotic prescription and she states that is what helped patient to the last time she had similar symptoms.  I will provide this for the patient prior to discharge.  The patient has been reexamined and is ready to be discharged.  All diagnostic results have been reviewed and discussed with the patient/family.  Care plan has been outlined and the patient/family understands all current diagnoses, results, and treatment plans.  There are no new complaints, changes, or physical findings at this time.  All questions have been addressed and answered.  [All medications, if any, that were given while in the emergency department or any that are being prescribed have been reviewed with the patient/family.  All side effects and adverse reactions have been explained.]  Patient was instructed to, and agrees to follow-up with their primary care physician as well as return to the emergency department if any new or worsening symptoms develop.   Naaman Plummer, MD 01/09/21 2045

## 2021-01-10 ENCOUNTER — Encounter: Payer: Self-pay | Admitting: *Deleted

## 2021-01-11 ENCOUNTER — Encounter: Payer: Self-pay | Admitting: Anesthesiology

## 2021-01-11 ENCOUNTER — Ambulatory Visit
Admission: RE | Admit: 2021-01-11 | Discharge: 2021-01-11 | Disposition: A | Discharge status: Home / Self Care | Payer: Medicare HMO | Attending: Gastroenterology | Admitting: Gastroenterology

## 2021-01-11 ENCOUNTER — Encounter
Admission: RE | Disposition: A | Discharge status: Home / Self Care | Payer: Self-pay | Source: Home / Self Care | Attending: Gastroenterology

## 2021-01-11 HISTORY — DX: Cerebral infarction, unspecified: I63.9

## 2021-01-11 LAB — URINE CULTURE

## 2021-01-11 SURGERY — EGD (ESOPHAGOGASTRODUODENOSCOPY)
Anesthesia: General

## 2021-01-11 MED ORDER — LIDOCAINE HCL (PF) 2 % IJ SOLN
INTRAMUSCULAR | Status: AC
  Filled 2021-01-11: qty 5

## 2021-01-11 MED ORDER — PROPOFOL 500 MG/50ML IV EMUL
INTRAVENOUS | Status: AC
  Filled 2021-01-11: qty 50

## 2021-01-11 NOTE — H&P (Signed)
Carolyn Kline EGD is to be rescheduled after discussion with her and her daughter. Pt reports she took her aggrenox within the last 2 days. Request has previously been made for this to be held for 7 days. We will attempt to reschedule her for next week while her aggrenox is still on hold.  Patient and her daughter are amenable to this.  Carolyn Helling, DO [facility]

## 2021-01-12 ENCOUNTER — Encounter: Payer: Self-pay | Admitting: *Deleted

## 2021-01-15 ENCOUNTER — Ambulatory Visit: Payer: Medicare HMO | Admitting: Anesthesiology

## 2021-01-15 ENCOUNTER — Ambulatory Visit
Admission: RE | Admit: 2021-01-15 | Discharge: 2021-01-15 | Disposition: A | Discharge status: Home / Self Care | Payer: Medicare HMO | Attending: Gastroenterology | Admitting: Gastroenterology

## 2021-01-15 ENCOUNTER — Encounter: Payer: Self-pay | Admitting: *Deleted

## 2021-01-15 ENCOUNTER — Encounter
Admission: RE | Disposition: A | Discharge status: Home / Self Care | Payer: Self-pay | Source: Home / Self Care | Attending: Gastroenterology

## 2021-01-15 DIAGNOSIS — K449 Diaphragmatic hernia without obstruction or gangrene: Secondary | ICD-10-CM | POA: Diagnosis not present

## 2021-01-15 DIAGNOSIS — R634 Abnormal weight loss: Secondary | ICD-10-CM | POA: Diagnosis not present

## 2021-01-15 DIAGNOSIS — K2970 Gastritis, unspecified, without bleeding: Secondary | ICD-10-CM | POA: Insufficient documentation

## 2021-01-15 DIAGNOSIS — Z681 Body mass index (BMI) 19 or less, adult: Secondary | ICD-10-CM | POA: Diagnosis not present

## 2021-01-15 DIAGNOSIS — Z79899 Other long term (current) drug therapy: Secondary | ICD-10-CM | POA: Diagnosis not present

## 2021-01-15 DIAGNOSIS — E785 Hyperlipidemia, unspecified: Secondary | ICD-10-CM | POA: Diagnosis not present

## 2021-01-15 DIAGNOSIS — Z7982 Long term (current) use of aspirin: Secondary | ICD-10-CM | POA: Diagnosis not present

## 2021-01-15 DIAGNOSIS — K224 Dyskinesia of esophagus: Secondary | ICD-10-CM | POA: Insufficient documentation

## 2021-01-15 DIAGNOSIS — K222 Esophageal obstruction: Secondary | ICD-10-CM | POA: Diagnosis not present

## 2021-01-15 DIAGNOSIS — R1310 Dysphagia, unspecified: Secondary | ICD-10-CM | POA: Insufficient documentation

## 2021-01-15 HISTORY — PX: ESOPHAGOGASTRODUODENOSCOPY: SHX5428

## 2021-01-15 SURGERY — EGD (ESOPHAGOGASTRODUODENOSCOPY)
Anesthesia: General

## 2021-01-15 MED ORDER — PROPOFOL 10 MG/ML IV BOLUS
INTRAVENOUS | Status: DC | PRN
  Administered 2021-01-15: 30 mg via INTRAVENOUS

## 2021-01-15 MED ORDER — SODIUM CHLORIDE 0.9 % IV SOLN
INTRAVENOUS | Status: DC
  Administered 2021-01-15: 20 mL/h via INTRAVENOUS

## 2021-01-15 MED ORDER — PROPOFOL 500 MG/50ML IV EMUL
INTRAVENOUS | Status: DC | PRN
  Administered 2021-01-15: 30 ug/kg/min via INTRAVENOUS

## 2021-01-15 MED ORDER — LIDOCAINE HCL (CARDIAC) PF 100 MG/5ML IV SOSY
PREFILLED_SYRINGE | INTRAVENOUS | Status: DC | PRN
  Administered 2021-01-15: 40 mg via INTRAVENOUS

## 2021-01-15 NOTE — Interval H&P Note (Signed)
History and Physical Interval Note: Preprocedure H&P from 01/15/21  was reviewed and there was no interval change after seeing and examining the patient.  Written consent was obtained from the patient after discussion of risks, benefits, and alternatives. Patient has consented to proceed with Esophagogastroduodenoscopy with possible intervention   01/15/2021 12:27 PM  Carolyn Kline  has presented today for surgery, with the diagnosis of Esophageal dysphagia (R13.19) Abnormal barium swallow (R93.3) Unintentional weight loss (R63.4) Early satiety (R68.81) Chronic cough (R05.3) Decreased appetite (R63.0).  The various methods of treatment have been discussed with the patient and family. After consideration of risks, benefits and other options for treatment, the patient has consented to  Procedure(s): ESOPHAGOGASTRODUODENOSCOPY (EGD) (N/A) as a surgical intervention.  The patient's history has been reviewed, patient examined, no change in status, stable for surgery.  I have reviewed the patient's chart and labs.  Questions were answered to the patient's satisfaction.     [name]

## 2021-01-15 NOTE — Anesthesia Preprocedure Evaluation (Signed)
Anesthesia Evaluation  Patient identified by MRN, date of birth, ID band Patient awake    Reviewed: Allergy & Precautions, NPO status , Patient's Chart, lab work & pertinent test results  History of Anesthesia Complications Negative for: history of anesthetic complications  Airway Mallampati: II  TM Distance: >3 FB Neck ROM: Full    Dental  (+) Lower Dentures, Upper Dentures   Pulmonary neg sleep apnea, neg COPD, Current SmokerPatient did not abstain from smoking.,    Pulmonary exam normal breath sounds clear to auscultation       Cardiovascular Exercise Tolerance: Good METShypertension, (-) CAD and (-) Past MI (-) dysrhythmias  Rhythm:Regular Rate:Normal - Systolic murmurs    Neuro/Psych CVA, No Residual Symptoms negative psych ROS   GI/Hepatic neg GERD  ,(+)     (-) substance abuse  ,   Endo/Other  neg diabetes  Renal/GU CRFRenal disease     Musculoskeletal   Abdominal   Peds  Hematology   Anesthesia Other Findings Past Medical History: No date: CVA (cerebral infarction) No date: Hyperlipemia No date: Hypertension No date: Stroke [facility])  Reproductive/Obstetrics                             Anesthesia Physical Anesthesia Plan  ASA: 3  Anesthesia Plan: General   Post-op Pain Management:    Induction: Intravenous  PONV Risk Score and Plan: 2 and Ondansetron, Propofol infusion and TIVA  Airway Management Planned: Nasal Cannula  Additional Equipment: None  Intra-op Plan:   Post-operative Plan:   Informed Consent: I have reviewed the patients History and Physical, chart, labs and discussed the procedure including the risks, benefits and alternatives for the proposed anesthesia with the patient or authorized representative who has indicated his/her understanding and acceptance.     Dental advisory given  Plan Discussed with: CRNA and Surgeon  Anesthesia Plan Comments:  (Discussed risks of anesthesia with patient, including possibility of difficulty with spontaneous ventilation under anesthesia necessitating airway intervention, PONV, and rare risks such as cardiac or respiratory or neurological events, and allergic reactions. Patient understands. Patient counseled on benefits of smoking cessation, and increased perioperative risks associated with continued smoking. )        Anesthesia Quick Evaluation

## 2021-01-15 NOTE — Anesthesia Postprocedure Evaluation (Signed)
Anesthesia Post Note  Patient: Carolyn Kline  Procedure(s) Performed: ESOPHAGOGASTRODUODENOSCOPY (EGD)  Patient location during evaluation: Endoscopy Anesthesia Type: General Level of consciousness: awake and alert Pain management: pain level controlled Vital Signs Assessment: post-procedure vital signs reviewed and stable Respiratory status: spontaneous breathing, nonlabored ventilation, respiratory function stable and patient connected to nasal cannula oxygen Cardiovascular status: blood pressure returned to baseline and stable Postop Assessment: no apparent nausea or vomiting Anesthetic complications: no   No notable events documented.   Last Vitals:  Vitals:   01/15/21 1311 01/15/21 1317  BP: 109/62 113/65  Pulse: (!) 56   Resp: 16   Temp:    SpO2: 100% 100%    Last Pain:  Vitals:   01/15/21 1317  TempSrc:   PainSc: 0-No pain                 Arita Miss

## 2021-01-15 NOTE — Transfer of Care (Signed)
Immediate Anesthesia Transfer of Care Note  Patient: Carolyn Kline  Procedure(s) Performed: ESOPHAGOGASTRODUODENOSCOPY (EGD)  Patient Location: PACU and Endoscopy Unit  Anesthesia Type:General  Level of Consciousness: drowsy  Airway & Oxygen Therapy: Patient Spontanous Breathing  Post-op Assessment: Report given to RN  Post vital signs: stable  Last Vitals:  Vitals Value Taken Time  BP    Temp    Pulse    Resp    SpO2      Last Pain:  Vitals:   01/15/21 1208  TempSrc: Temporal  PainSc: 0-No pain         Complications: No notable events documented.

## 2021-01-15 NOTE — Op Note (Signed)
[facility] Patient Name: Carolyn Kline Procedure Date: 01/15/2021 12:31 PM MRN: [id number] Account #: [id number] Date of Birth: 28-May-1928 Admit Type: Outpatient Age: 87 Room: [facility] ENDO ROOM 2 Gender: Female Note Status: Finalized Procedure:             Upper GI endoscopy Indications:           Dysphagia, Weight loss Providers:             Rueben Bash, DO Referring MD:          Adrian Prows (Referring MD) Medicines:             Monitored Anesthesia Care Complications:         No immediate complications. Estimated blood loss:                         Minimal. Procedure:             Pre-Anesthesia Assessment:                        - Prior to the procedure, a History and Physical was                         performed, and patient medications and allergies were                         reviewed. The patient is competent. The risks and                         benefits of the procedure and the sedation options and                         risks were discussed with the patient. All questions                         were answered and informed consent was obtained.                         Patient identification and proposed procedure were                         verified by the physician, the nurse, the anesthetist                         and the technician in the endoscopy suite. Mental                         Status Examination: alert and oriented. Airway                         Examination: normal oropharyngeal airway and neck                         mobility. Respiratory Examination: clear to                         auscultation. CV Examination: RRR, no murmurs, no S3  or S4. Prophylactic Antibiotics: The patient does not                         require prophylactic antibiotics. Prior                         Anticoagulants: The patient has taken antiplatelet                         medication (aggrenox), last dose  was 6 days prior to                         procedure. ASA Grade Assessment: III - A patient with                         severe systemic disease. After reviewing the risks and                         benefits, the patient was deemed in satisfactory                         condition to undergo the procedure. The anesthesia                         plan was to use monitored anesthesia care (MAC).                         Immediately prior to administration of medications,                         the patient was re-assessed for adequacy to receive                         sedatives. The heart rate, respiratory rate, oxygen                         saturations, blood pressure, adequacy of pulmonary                         ventilation, and response to care were monitored                         throughout the procedure. The physical status of the                         patient was re-assessed after the procedure.                        After obtaining informed consent, the endoscope was                         passed under direct vision. Throughout the procedure,                         the patient's blood pressure, pulse, and oxygen                         saturations were monitored continuously. The Endoscope  was introduced through the mouth, and advanced to the                         second part of duodenum. The upper GI endoscopy was                         accomplished without difficulty. The patient tolerated                         the procedure well. Findings:      The duodenal bulb, first portion of the duodenum and second portion of       the duodenum were normal. Estimated blood loss: none.      Diffuse mild inflammation characterized by erythema was found in the       stomach. Estimated blood loss: none.      A 4 cm hiatal hernia was present. Estimated blood loss: none.      Esophagogastric landmarks were identified: the gastroesophageal junction       was  found at 37 cm from the incisors. Tortuous and spastic esophagus      One benign-appearing, intrinsic moderate (circumferential scarring or       stenosis; an endoscope may pass) stenosis was found. Stenosis at GEJ       easily able to be passed by the gastroscope.). A guidewire was placed       and the scope was withdrawn. Dilation was performed with a Savary       dilator with no resistance at 42 Fr. The dilation site was examined       following endoscope reinsertion and showed mild mucosal disruption/small       superficial tear. Estimated blood loss was minimal. Esophageal mucosa       was otherwise normal Impression:            - Normal duodenal bulb, first portion of the duodenum                         and second portion of the duodenum.                        - Gastritis.                        - 4 cm hiatal hernia.                        - Esophagogastric landmarks identified. Tortuous and                         spastic esophagus consistent with tertiary contractions                        - Benign-appearing esophageal stenosis. Dilated.                        - No specimens collected. Recommendation:        - Discharge patient to home.                        - Soft diet, moisten and chew food well. soft foods  for 24 hours post procedure.                        - Follow an antireflux regimen.                        -Eat a soft diet, moisten and chew food well. soft                         foods for 24 hours post procedure.                        - hold aggrenox for 48 hours post procedure Procedure Code(s):     --- Professional ---                        [phone], Esophagogastroduodenoscopy, flexible,                         transoral; with insertion of guide wire followed by                         passage of dilator(s) through esophagus over guide wire Diagnosis Code(s):     --- Professional ---                        K29.70, Gastritis, unspecified,  without bleeding                        K44.9, Diaphragmatic hernia without obstruction or                         gangrene                        K22.2, Esophageal obstruction                        R13.10, Dysphagia, unspecified                        R63.4, Abnormal weight loss CPT copyright 2019 American Medical Association. All rights reserved. The codes documented in this report are preliminary and upon coder review may  be revised to meet current compliance requirements. Attending Participation:      I personally performed the entire procedure. Volney American, DO [name] DO, DO 01/15/2021 1:02:47 PM This report has been signed electronically. Number of Addenda: 0 Note Initiated On: 01/15/2021 12:31 PM Estimated Blood Loss:  Estimated blood loss was minimal.      [facility]

## 2021-01-15 NOTE — H&P (Signed)
[facility] Pre-Procedure H&P   Patient ID: Carolyn Kline is a 87 y.o. female.  Gastroenterology Provider: [name], DO  Referring Provider: Octavia Bruckner, PA PCP: Leonel Ramsay, MD  Date: 01/15/2021  HPI Ms. Carolyn Kline is a 87 y.o. female who presents today for Esophagogastroduodenoscopy for dysphagia, abnormal barium swallow study, early satiety, weight loss.  She was seen in clinic on 12/25/20 noting dysphagia.. Mostly to solids, no issues with liquids. Has chronic cough. Lower sternal/xiphoid sticking. No regurgitaiton or odynophagia. She underwent a barium swallow study which demonstrated moderate to severe esophageal dymotility with tertiary contractions and delayed progression. 59mm tablet did not pass. No hiatal hernia noted. Has lost approximately 20 pounds.  CT demonstrated patulous esophagus which was mildly fluid filled with periesopahgeal stranding.  Aggrenox has been held for 1 week prior to procedure.   Past Medical History:  Diagnosis Date   CVA (cerebral infarction)    Hyperlipemia    Hypertension    Stroke [facility])     Past Surgical History:  Procedure Laterality Date   EYE SURGERY Bilateral 01/2020   Eyelid Surgery    Family History No h/o GI disease or malignancy  Review of Systems  Constitutional:  Positive for appetite change, fatigue and unexpected weight change. Negative for activity change and fever.  HENT:  Positive for trouble swallowing. Negative for voice change. Sinus pain: early satiety.  Respiratory:  Positive for cough. Negative for shortness of breath and wheezing.   Cardiovascular:  Negative for chest pain and palpitations.  Gastrointestinal:  Negative for abdominal distention, abdominal pain, anal bleeding, blood in stool, constipation, diarrhea, nausea, rectal pain and vomiting.  Musculoskeletal:  Negative for arthralgias and myalgias.  Skin:  Negative for color change and pallor.  Neurological:   Negative for dizziness, syncope and weakness.  Psychiatric/Behavioral:  Negative for agitation and confusion.   All other systems reviewed and are negative.   Medications No current facility-administered medications on file prior to encounter.   Current Outpatient Medications on File Prior to Encounter  Medication Sig Dispense Refill   ascorbic acid (VITAMIN C) 500 MG tablet Take 1 tablet (500 mg total) by mouth daily. 30 tablet 0   calcium carbonate (OSCAL) 1500 (600 CA) MG TABS tablet Take by mouth 2 (two) times daily with a meal.     dipyridamole-aspirin (AGGRENOX) 200-25 MG 12hr capsule Take 1 capsule by mouth 2 (two) times daily.     mirtazapine (REMERON) 30 MG tablet Take 30 mg by mouth at bedtime.     vitamin B-12 (CYANOCOBALAMIN) 1000 MCG tablet Take 1 tablet (1,000 mcg total) by mouth daily. 30 tablet 0   zinc sulfate 220 (50 Zn) MG capsule Take 1 capsule (220 mg total) by mouth daily. 30 capsule 0   chlorpheniramine-HYDROcodone (TUSSIONEX) 10-8 MG/5ML SUER Take 5 mLs by mouth every 12 (twelve) hours as needed for cough. (Patient not taking: Reported on 01/11/2021) 140 mL 0   guaiFENesin-dextromethorphan (ROBITUSSIN DM) 100-10 MG/5ML syrup Take 10 mLs by mouth every 4 (four) hours as needed for cough. (Patient not taking: Reported on 01/11/2021) 118 mL 0   simvastatin (ZOCOR) 40 MG tablet Take 40 mg by mouth daily. (Patient not taking: Reported on 01/11/2021)      Pertinent medications related to GI and procedure were reviewed by me with the patient prior to the procedure   Current Facility-Administered Medications:    0.9 %  sodium chloride infusion, , Intravenous, Continuous, [name], DO, Last  Rate: 20 mL/hr at 01/15/21 1220, 20 mL/hr at 01/15/21 1220  sodium chloride 20 mL/hr (01/15/21 1220)       No Known Allergies Allergies were reviewed by me prior to the procedure  Objective    Vitals:   01/15/21 1208  BP: 115/74  Pulse: 86  Resp: 20  Temp: (!) 96.7  F (35.9 C)  TempSrc: Temporal  SpO2: 100%  Weight: 45.4 kg  Height: 5\' 5"  (1.651 m)     Physical Exam Vitals and nursing note reviewed.  Constitutional:      General: She is not in acute distress.    Appearance: She is not ill-appearing, toxic-appearing or diaphoretic.     Comments: underweight  HENT:     Head: Normocephalic and atraumatic.     Nose: Nose normal.     Mouth/Throat:     Mouth: Mucous membranes are moist.     Pharynx: Oropharynx is clear.  Eyes:     General: No scleral icterus.    Extraocular Movements: Extraocular movements intact.  Cardiovascular:     Rate and Rhythm: Normal rate and regular rhythm.     Heart sounds: Normal heart sounds. No murmur heard.   No friction rub. No gallop.  Pulmonary:     Effort: Pulmonary effort is normal. No respiratory distress.     Breath sounds: Normal breath sounds. No wheezing, rhonchi or rales.  Abdominal:     General: Abdomen is flat. Bowel sounds are normal. There is no distension.     Palpations: Abdomen is soft.     Tenderness: There is no abdominal tenderness. There is no guarding or rebound.  Musculoskeletal:     Cervical back: Neck supple.     Right lower leg: No edema.     Left lower leg: No edema.  Skin:    General: Skin is warm and dry.     Coloration: Skin is not jaundiced or pale.  Neurological:     General: No focal deficit present.     Mental Status: She is alert and oriented to person, place, and time. Mental status is at baseline.  Psychiatric:        Mood and Affect: Mood normal.        Behavior: Behavior normal.        Thought Content: Thought content normal.        Judgment: Judgment normal.     Assessment:  Carolyn Kline is a 87 y.o. female  who presents today for Esophagogastroduodenoscopy for dysphagia, abnormal barium swallow study, early satiety, weight loss.  Plan:  Esophagogastroduodenoscopy with possible intervention today Aggrenox held for 1 week prior to  procedure  Esophagogastroduodenoscopy with possible biopsy, control of bleeding, polypectomy, and interventions as necessary has been discussed with the patient/patient representative. Informed consent was obtained from the patient/patient representative after explaining the indication, nature, and risks of the procedure including but not limited to death, bleeding, perforation, missed neoplasm/lesions, cardiorespiratory compromise, and reaction to medications. Opportunity for questions was given and appropriate answers were provided. Patient/patient representative has verbalized understanding is amenable to undergoing the procedure.   [name], DO  [facility]  Portions of the record may have been created with voice recognition software. Occasional wrong-word or 'sound-a-like' substitutions may have occurred due to the inherent limitations of voice recognition software.  Read the chart carefully and recognize, using context, where substitutions may have occurred.

## 2021-01-16 ENCOUNTER — Encounter: Payer: Self-pay | Admitting: Gastroenterology

## 2021-01-23 DIAGNOSIS — N1831 Chronic kidney disease, stage 3a: Secondary | ICD-10-CM | POA: Diagnosis not present

## 2021-01-23 DIAGNOSIS — M0579 Rheumatoid arthritis with rheumatoid factor of multiple sites without organ or systems involvement: Secondary | ICD-10-CM | POA: Diagnosis not present

## 2021-01-24 DIAGNOSIS — R634 Abnormal weight loss: Secondary | ICD-10-CM | POA: Diagnosis not present

## 2021-01-24 DIAGNOSIS — N189 Chronic kidney disease, unspecified: Secondary | ICD-10-CM | POA: Diagnosis not present

## 2021-01-24 DIAGNOSIS — M069 Rheumatoid arthritis, unspecified: Secondary | ICD-10-CM | POA: Diagnosis not present

## 2021-01-24 DIAGNOSIS — J449 Chronic obstructive pulmonary disease, unspecified: Secondary | ICD-10-CM | POA: Diagnosis not present

## 2021-02-01 DIAGNOSIS — N814 Uterovaginal prolapse, unspecified: Secondary | ICD-10-CM | POA: Diagnosis not present

## 2021-02-01 DIAGNOSIS — N8111 Cystocele, midline: Secondary | ICD-10-CM | POA: Diagnosis not present

## 2021-02-06 DIAGNOSIS — Z720 Tobacco use: Secondary | ICD-10-CM | POA: Diagnosis not present

## 2021-02-06 DIAGNOSIS — R5383 Other fatigue: Secondary | ICD-10-CM | POA: Diagnosis not present

## 2021-02-06 DIAGNOSIS — K224 Dyskinesia of esophagus: Secondary | ICD-10-CM | POA: Diagnosis not present

## 2021-02-06 DIAGNOSIS — R634 Abnormal weight loss: Secondary | ICD-10-CM | POA: Diagnosis not present

## 2021-02-06 DIAGNOSIS — Z8719 Personal history of other diseases of the digestive system: Secondary | ICD-10-CM | POA: Diagnosis not present

## 2021-02-06 DIAGNOSIS — Z9889 Other specified postprocedural states: Secondary | ICD-10-CM | POA: Diagnosis not present

## 2021-02-21 DIAGNOSIS — Z23 Encounter for immunization: Secondary | ICD-10-CM | POA: Diagnosis not present

## 2021-02-21 DIAGNOSIS — R1319 Other dysphagia: Secondary | ICD-10-CM | POA: Diagnosis not present

## 2021-02-21 DIAGNOSIS — N184 Chronic kidney disease, stage 4 (severe): Secondary | ICD-10-CM | POA: Diagnosis not present

## 2021-02-21 DIAGNOSIS — N183 Chronic kidney disease, stage 3 unspecified: Secondary | ICD-10-CM | POA: Diagnosis not present

## 2021-02-21 DIAGNOSIS — R634 Abnormal weight loss: Secondary | ICD-10-CM | POA: Diagnosis not present

## 2021-02-21 DIAGNOSIS — M069 Rheumatoid arthritis, unspecified: Secondary | ICD-10-CM | POA: Diagnosis not present

## 2021-02-21 DIAGNOSIS — M059 Rheumatoid arthritis with rheumatoid factor, unspecified: Secondary | ICD-10-CM | POA: Diagnosis not present

## 2021-02-21 DIAGNOSIS — R202 Paresthesia of skin: Secondary | ICD-10-CM | POA: Diagnosis not present

## 2021-02-21 DIAGNOSIS — K222 Esophageal obstruction: Secondary | ICD-10-CM | POA: Diagnosis not present

## 2021-03-06 ENCOUNTER — Other Ambulatory Visit: Payer: Self-pay

## 2021-03-06 ENCOUNTER — Ambulatory Visit
Admission: RE | Admit: 2021-03-06 | Discharge: 2021-03-06 | Disposition: A | Discharge status: Home / Self Care | Payer: Medicare HMO | Source: Ambulatory Visit | Attending: Oncology | Admitting: Oncology

## 2021-03-06 DIAGNOSIS — R9389 Abnormal findings on diagnostic imaging of other specified body structures: Secondary | ICD-10-CM | POA: Diagnosis not present

## 2021-03-06 DIAGNOSIS — J439 Emphysema, unspecified: Secondary | ICD-10-CM | POA: Diagnosis not present

## 2021-03-06 DIAGNOSIS — I700 Atherosclerosis of aorta: Secondary | ICD-10-CM | POA: Diagnosis not present

## 2021-03-06 DIAGNOSIS — R911 Solitary pulmonary nodule: Secondary | ICD-10-CM | POA: Diagnosis not present

## 2021-03-08 ENCOUNTER — Inpatient Hospital Stay: Payer: Medicare HMO | Attending: Oncology | Admitting: Oncology

## 2021-03-09 DIAGNOSIS — M059 Rheumatoid arthritis with rheumatoid factor, unspecified: Secondary | ICD-10-CM | POA: Diagnosis not present

## 2021-03-09 DIAGNOSIS — I129 Hypertensive chronic kidney disease with stage 1 through stage 4 chronic kidney disease, or unspecified chronic kidney disease: Secondary | ICD-10-CM | POA: Diagnosis not present

## 2021-03-09 DIAGNOSIS — J431 Panlobular emphysema: Secondary | ICD-10-CM | POA: Diagnosis not present

## 2021-03-09 DIAGNOSIS — N184 Chronic kidney disease, stage 4 (severe): Secondary | ICD-10-CM | POA: Diagnosis not present

## 2021-03-13 DIAGNOSIS — M0579 Rheumatoid arthritis with rheumatoid factor of multiple sites without organ or systems involvement: Secondary | ICD-10-CM | POA: Diagnosis not present

## 2021-03-13 DIAGNOSIS — N1831 Chronic kidney disease, stage 3a: Secondary | ICD-10-CM | POA: Diagnosis not present

## 2021-03-15 ENCOUNTER — Telehealth: Payer: Self-pay | Admitting: Oncology

## 2021-03-15 NOTE — Telephone Encounter (Signed)
Pt daughter Marliss Coots called and stated she was not aware Azizi had an appt to for her CT results. She would like a call back to either discuss the results or if you would need them to come in .

## 2021-03-26 ENCOUNTER — Telehealth: Payer: Self-pay | Admitting: Oncology

## 2021-03-26 NOTE — Telephone Encounter (Signed)
Pt scheduled for telephone visit to discuss CT scan results per Sonia Baller.

## 2021-03-26 NOTE — Telephone Encounter (Signed)
This is a CT screen she had not PET

## 2021-03-26 NOTE — Telephone Encounter (Signed)
Carolyn Kline - pls reach out to patient. Our team has never seen this patient.

## 2021-03-26 NOTE — Telephone Encounter (Signed)
Daughter called in to talk to Carolyn Kline about results to pt's scan. Please call back at [phone]

## 2021-03-26 NOTE — Telephone Encounter (Signed)
Not sure what they are talking about. She is a lung nodule patient.   Sonia Baller

## 2021-03-27 ENCOUNTER — Inpatient Hospital Stay: Payer: Medicare HMO | Attending: Oncology | Admitting: Oncology

## 2021-03-27 ENCOUNTER — Other Ambulatory Visit: Payer: Self-pay

## 2021-03-27 DIAGNOSIS — R911 Solitary pulmonary nodule: Secondary | ICD-10-CM

## 2021-03-27 DIAGNOSIS — Z87891 Personal history of nicotine dependence: Secondary | ICD-10-CM

## 2021-03-27 NOTE — Progress Notes (Signed)
Pulmonary Nodule Clinic Consult note [facility]  Telephone:([phone] [phone]  Patient Care Team: Leonel Ramsay, MD as PCP - General (Infectious Diseases)   Name of the patient: Carolyn Kline  [id number]  Sep 25, 1928   Date of visit: 03/27/2021   Diagnosis- Lung Nodule  Virtual Visit via Telephone Note   I connected with Carolyn Kline on 03/27/21 at 1030am by telephone visit and verified that I am speaking with the correct person using two identifiers.   I discussed the limitations, risks, security and privacy concerns of performing an evaluation and management service by telemedicine and the availability of in-person appointments. I also discussed with the patient that there may be a patient responsible charge related to this service. The patient expressed understanding and agreed to proceed.   Other persons participating in the visit and their role in the encounter:   Patient's location: Home  Provider's location: Clinic   Chief complaint/ Reason for visit- Pulmonary Nodule Clinic Initial Visit  Past Medical History:  Carolyn Kline is a 87 year old female with past medical history significant for hypertension, acute renal failure and a history of a stroke who was referred by Dr. Ola Spurr Carolyn Kline primary care provider after reviewing a recent CT scan (03/20/20) that showed a left base opacity which likely represented atelectasis or pneumonia.  It also revealed a 5 mm left lower lobe pulmonary nodule and recommended a 57-month follow-up.  Interval history-today Carolyn Kline presents via telephone to discuss results of recent CT scan.  Patient was evaluated in the emergency room on 01/09/2021 for pelvic pain.  Work-up included imaging which did not show any acute abnormalities however it did show an intrarenal stone raising the possibility of recent kidney stone.  Carolyn Kline was given a prescription for cefdinir.   Carolyn Kline was evaluated by Dr. Virgina Jock on  01/15/2021 for dysphagia, abnormal barium swallow study in early satiety.  Barium swallow demonstrated moderate to severe esophageal dysmotility. EGD showed benign-appearing esophageal stenosis which was dilated.  Carolyn Kline is followed by rheumatology Dr. Jefm Bryant for rheumatoid arthritis Carolyn Kline is currently on Plaquenil and 5 mg of prednisone.  Symptoms have resolved.  Today, Carolyn Kline states Carolyn Kline is doing well.  Denies any recent hospitalizations.  Eating and drinking has improved since recent EGD and esophageal stretching.  Carolyn Kline eats soft foods.  Carolyn Kline lives with Carolyn Kline son.  Carolyn Kline is a current everyday smoker. Carolyn Kline has been smoking for about 60 year. 1 pack every other day. Breathing is good. No oxygen. Wore oxygen after Covid but no longer uses.   No family history of cancer. Mother died of a stroke.    ECOG FS:1 - Symptomatic but completely ambulatory  Review of systems- Review of Systems  Constitutional:  Positive for weight loss. Negative for chills, fever and malaise/fatigue.  HENT:  Negative for congestion, ear pain and tinnitus.        Dysphagia  Eyes: Negative.  Negative for blurred vision and double vision.  Respiratory:  Positive for shortness of breath. Negative for cough and sputum production.   Cardiovascular: Negative.  Negative for chest pain, palpitations and leg swelling.  Gastrointestinal: Negative.  Negative for abdominal pain, constipation, diarrhea, nausea and vomiting.  Genitourinary:  Negative for dysuria, frequency and urgency.  Musculoskeletal:  Positive for joint pain. Negative for back pain and falls.  Skin: Negative.  Negative for rash.  Neurological: Negative.  Negative for weakness and headaches.  Endo/Heme/Allergies: Negative.  Does not bruise/bleed easily.  Psychiatric/Behavioral: Negative.  Negative for  depression. The patient is not nervous/anxious and does not have insomnia.     No Known Allergies   Past Medical History:  Diagnosis Date   CVA (cerebral infarction)     Hyperlipemia    Hypertension    Stroke [facility])      Past Surgical History:  Procedure Laterality Date   ESOPHAGOGASTRODUODENOSCOPY N/A 01/15/2021   Procedure: ESOPHAGOGASTRODUODENOSCOPY (EGD);  Surgeon: [name], DO;  Location: [facility] ENDOSCOPY;  Service: Gastroenterology;  Laterality: N/A;   EYE SURGERY Bilateral 01/2020   Eyelid Surgery    Social History   Socioeconomic History   Marital status: Widowed    Spouse name: Not on file   Number of children: Not on file   Years of education: Not on file   Highest education level: Not on file  Occupational History   Not on file  Tobacco Use   Smoking status: Every Day    Packs/day: 0.75    Years: 72.00    Pack years: 54.00    Types: Cigarettes   Smokeless tobacco: Never  Vaping Use   Vaping Use: Never used  Substance and Sexual Activity   Alcohol use: No   Drug use: No   Sexual activity: Not on file  Other Topics Concern   Not on file  Social History Narrative   Not on file   Social Determinants of Health   Financial Resource Strain: Not on file  Food Insecurity: Not on file  Transportation Needs: Not on file  Physical Activity: Not on file  Stress: Not on file  Social Connections: Not on file  Intimate Partner Violence: Not on file    No family history on file.   Current Outpatient Medications:    ascorbic acid (VITAMIN C) 500 MG tablet, Take 1 tablet (500 mg total) by mouth daily., Disp: 30 tablet, Rfl: 0   calcium carbonate (OSCAL) 1500 (600 CA) MG TABS tablet, Take by mouth 2 (two) times daily with a meal., Disp: , Rfl:    chlorpheniramine-HYDROcodone (TUSSIONEX) 10-8 MG/5ML SUER, Take 5 mLs by mouth every 12 (twelve) hours as needed for cough. (Patient not taking: Reported on 01/11/2021), Disp: 140 mL, Rfl: 0   dipyridamole-aspirin (AGGRENOX) 200-25 MG 12hr capsule, Take 1 capsule by mouth 2 (two) times daily., Disp: , Rfl:    guaiFENesin-dextromethorphan (ROBITUSSIN DM) 100-10 MG/5ML syrup, Take 10  mLs by mouth every 4 (four) hours as needed for cough. (Patient not taking: Reported on 01/11/2021), Disp: 118 mL, Rfl: 0   mirtazapine (REMERON) 30 MG tablet, Take 30 mg by mouth at bedtime., Disp: , Rfl:    simvastatin (ZOCOR) 40 MG tablet, Take 40 mg by mouth daily. (Patient not taking: Reported on 01/11/2021), Disp: , Rfl:    vitamin B-12 (CYANOCOBALAMIN) 1000 MCG tablet, Take 1 tablet (1,000 mcg total) by mouth daily., Disp: 30 tablet, Rfl: 0   zinc sulfate 220 (50 Zn) MG capsule, Take 1 capsule (220 mg total) by mouth daily., Disp: 30 capsule, Rfl: 0  Physical exam: There were no vitals filed for this visit. Physical Exam Neurological:     Mental Status: Carolyn Kline is oriented to person, place, and time. Mental status is at baseline.     CMP Latest Ref Rng & Units 01/09/2021  Glucose 70 - 99 mg/dL 131(H)  BUN 8 - 23 mg/dL 30(H)  Creatinine 0.44 - 1.00 mg/dL 1.78(H)  Sodium 135 - 145 mmol/L 137  Potassium 3.5 - 5.1 mmol/L 4.5  Chloride 98 - 111 mmol/L 103  CO2 22 - 32 mmol/L 23  Calcium 8.9 - 10.3 mg/dL 9.1  Total Protein 6.5 - 8.1 g/dL 7.3  Total Bilirubin 0.3 - 1.2 mg/dL 0.6  Alkaline Phos 38 - 126 U/L 60  AST 15 - 41 U/L 15  ALT 0 - 44 U/L 13   CBC Latest Ref Rng & Units 01/09/2021  WBC 4.0 - 10.5 K/uL 7.9  Hemoglobin 12.0 - 15.0 g/dL 12.0  Hematocrit 36.0 - 46.0 % 36.7  Platelets 150 - 400 K/uL 246    No images are attached to the encounter.  CT Chest Wo Contrast  Result Date: 03/07/2021 CLINICAL DATA:  Follow-up left lower lobe pulmonary nodule. EXAM: CT CHEST WITHOUT CONTRAST TECHNIQUE: Multidetector CT imaging of the chest was performed following the standard protocol without IV contrast. COMPARISON:  03/20/2020 FINDINGS: Cardiovascular: Aortic atherosclerosis. Ascending thoracic aorta measures up to 3.9 x 3.9 cm. Unchanged focal aneurysm of the lateral aspect of the distal aortic arch, maximum caliber of the vessel in this vicinity 3.7 x 3.3 cm (series 2, image 53). Normal  heart size. Three-vessel coronary artery calcifications. No pericardial effusion. Mediastinum/Nodes: No enlarged mediastinal, hilar, or axillary lymph nodes. Thyroid gland, trachea, and esophagus demonstrate no significant findings. Lungs/Pleura: Moderate centrilobular and paraseptal emphysema. Stable, benign 5 mm pulmonary nodule of the left lower lobe (series 3, image 114). Mild bibasilar scarring. No pleural effusion or pneumothorax. Upper Abdomen: No acute abnormality. Musculoskeletal: No chest wall mass or suspicious bone lesions identified. IMPRESSION: 1. Stable, benign 5 mm pulmonary nodule of the left lower lobe, for which no further follow-up or characterization is required. 2. Moderate emphysema. 3. Coronary artery disease. 4. Unchanged focal aneurysm of the lateral aspect of the distal aortic arch, maximum caliber of the vessel in this vicinity 3.7 x 3.3 cm. Recommend annual imaging followup by CTA or MRA if clinically appropriate. This recommendation follows 2010 ACCF/AHA/AATS/ACR/ASA/SCA/SCAI/SIR/STS/SVM Guidelines for the Diagnosis and Management of Patients with Thoracic Aortic Disease. Circulation.2010; 121: B762-G315. Aortic aneurysm NOS (ICD10-I71.9) Aortic Atherosclerosis (ICD10-I70.0) and Emphysema (ICD10-J43.9). Electronically Signed   By: Delanna Ahmadi M.D.   On: 03/07/2021 12:16     Assessment and plan- Patient is a 87 y.o. female who presents to pulmonary nodule clinic for follow-up of incidental lung nodules.  A telephone visit was conducted to review most recent CT scan results.   CT scan from 03/06/2021 shows a stable benign 5 mm pulmonary nodule of the left lower lobe, moderate emphysema and unchanged aortic aneurysm measuring 3.7 x 3.3 cm in size.  Follow-up recommended for aortic aneurysm.   Given patient has smoked for greater than 60 years, Carolyn Kline is at high risk for the development of lung cancer.  After speaking with Carolyn Kline daughter, patient is not interested in quitting smoking at  the age of 57.  Recommended routine follow-up with Dr. Ola Spurr.  Carolyn Kline does not qualify for low-dose CT screening program given Carolyn Kline age.   Plan-patient does not need any additional follow-up with [place] in our clinic.  Carolyn Kline does not meet criteria for the low-dose CT screening program.  Recommend Carolyn Kline continue follow-up with Dr. Ola Spurr.  Daughter Jeannene Patella is in agreement.  Disposition-no follow-up needed.   Visit Diagnosis 1. Lung nodule   2. Personal history of tobacco use, presenting hazards to health     Patient expressed understanding and was in agreement with this plan. Carolyn Kline also understands that Carolyn Kline can call clinic at any time with any questions, concerns, or complaints.   Greater than 50%  was spent in counseling and coordination of care with this patient including but not limited to discussion of the relevant topics above (See A&P) including, but not limited to diagnosis and management of acute and chronic medical conditions.   Thank you for allowing me to participate in the care of this very pleasant patient.    Jacquelin Hawking, NP [place] [facility] Cell - [phone] Pager- [phone] 03/27/2021 10:29 AM

## 2021-04-17 DIAGNOSIS — N1831 Chronic kidney disease, stage 3a: Secondary | ICD-10-CM | POA: Diagnosis not present

## 2021-04-17 DIAGNOSIS — M0579 Rheumatoid arthritis with rheumatoid factor of multiple sites without organ or systems involvement: Secondary | ICD-10-CM | POA: Diagnosis not present

## 2021-04-24 DIAGNOSIS — N1831 Chronic kidney disease, stage 3a: Secondary | ICD-10-CM | POA: Diagnosis not present

## 2021-04-24 DIAGNOSIS — M0579 Rheumatoid arthritis with rheumatoid factor of multiple sites without organ or systems involvement: Secondary | ICD-10-CM | POA: Diagnosis not present

## 2021-11-28 DIAGNOSIS — E785 Hyperlipidemia, unspecified: Secondary | ICD-10-CM | POA: Diagnosis not present

## 2021-11-28 DIAGNOSIS — R634 Abnormal weight loss: Secondary | ICD-10-CM | POA: Diagnosis not present

## 2021-11-28 DIAGNOSIS — N189 Chronic kidney disease, unspecified: Secondary | ICD-10-CM | POA: Diagnosis not present

## 2021-11-28 DIAGNOSIS — I714 Abdominal aortic aneurysm, without rupture, unspecified: Secondary | ICD-10-CM | POA: Diagnosis not present

## 2021-11-28 DIAGNOSIS — M069 Rheumatoid arthritis, unspecified: Secondary | ICD-10-CM | POA: Diagnosis not present

## 2021-11-28 DIAGNOSIS — Z8673 Personal history of transient ischemic attack (TIA), and cerebral infarction without residual deficits: Secondary | ICD-10-CM | POA: Diagnosis not present

## 2021-11-28 DIAGNOSIS — F17210 Nicotine dependence, cigarettes, uncomplicated: Secondary | ICD-10-CM | POA: Diagnosis not present

## 2021-11-28 DIAGNOSIS — J449 Chronic obstructive pulmonary disease, unspecified: Secondary | ICD-10-CM | POA: Diagnosis not present

## 2021-11-30 ENCOUNTER — Other Ambulatory Visit
Admission: RE | Admit: 2021-11-30 | Discharge: 2021-11-30 | Disposition: A | Discharge status: Home / Self Care | Payer: Medicare HMO | Source: Ambulatory Visit | Attending: Rheumatology | Admitting: Rheumatology

## 2021-11-30 DIAGNOSIS — M0579 Rheumatoid arthritis with rheumatoid factor of multiple sites without organ or systems involvement: Secondary | ICD-10-CM | POA: Insufficient documentation

## 2021-11-30 DIAGNOSIS — Z79899 Other long term (current) drug therapy: Secondary | ICD-10-CM | POA: Diagnosis not present

## 2021-11-30 LAB — SYNOVIAL CELL COUNT + DIFF, W/ CRYSTALS
Crystals, Fluid: NONE SEEN
Eosinophils-Synovial: 0 %
Lymphocytes-Synovial Fld: 2 %
Monocyte-Macrophage-Synovial Fluid: 16 %
Neutrophil, Synovial: 82 %
WBC, Synovial: 40921 /mm3 — ABNORMAL HIGH (ref 0–200)

## 2021-12-10 ENCOUNTER — Encounter: Payer: Self-pay | Admitting: Emergency Medicine

## 2021-12-10 ENCOUNTER — Inpatient Hospital Stay
Admission: EM | Admit: 2021-12-10 | Discharge: 2021-12-12 | DRG: 871 | Disposition: A | Discharge status: Home Health | Payer: Medicare HMO | Attending: Internal Medicine | Admitting: Internal Medicine

## 2021-12-10 ENCOUNTER — Other Ambulatory Visit: Payer: Self-pay

## 2021-12-10 ENCOUNTER — Emergency Department: Payer: Medicare HMO

## 2021-12-10 DIAGNOSIS — J189 Pneumonia, unspecified organism: Secondary | ICD-10-CM | POA: Diagnosis present

## 2021-12-10 DIAGNOSIS — N179 Acute kidney failure, unspecified: Secondary | ICD-10-CM | POA: Diagnosis not present

## 2021-12-10 DIAGNOSIS — N184 Chronic kidney disease, stage 4 (severe): Secondary | ICD-10-CM | POA: Diagnosis not present

## 2021-12-10 DIAGNOSIS — E785 Hyperlipidemia, unspecified: Secondary | ICD-10-CM | POA: Diagnosis not present

## 2021-12-10 DIAGNOSIS — E872 Acidosis, unspecified: Secondary | ICD-10-CM | POA: Diagnosis not present

## 2021-12-10 DIAGNOSIS — S0083XA Contusion of other part of head, initial encounter: Secondary | ICD-10-CM | POA: Diagnosis present

## 2021-12-10 DIAGNOSIS — M069 Rheumatoid arthritis, unspecified: Secondary | ICD-10-CM | POA: Diagnosis present

## 2021-12-10 DIAGNOSIS — A419 Sepsis, unspecified organism: Principal | ICD-10-CM | POA: Diagnosis present

## 2021-12-10 DIAGNOSIS — Z8673 Personal history of transient ischemic attack (TIA), and cerebral infarction without residual deficits: Secondary | ICD-10-CM | POA: Diagnosis not present

## 2021-12-10 DIAGNOSIS — W1830XA Fall on same level, unspecified, initial encounter: Secondary | ICD-10-CM | POA: Diagnosis present

## 2021-12-10 DIAGNOSIS — Y92009 Unspecified place in unspecified non-institutional (private) residence as the place of occurrence of the external cause: Secondary | ICD-10-CM

## 2021-12-10 DIAGNOSIS — R6520 Severe sepsis without septic shock: Secondary | ICD-10-CM | POA: Diagnosis not present

## 2021-12-10 DIAGNOSIS — M47812 Spondylosis without myelopathy or radiculopathy, cervical region: Secondary | ICD-10-CM | POA: Diagnosis not present

## 2021-12-10 DIAGNOSIS — I10 Essential (primary) hypertension: Secondary | ICD-10-CM | POA: Diagnosis not present

## 2021-12-10 DIAGNOSIS — I129 Hypertensive chronic kidney disease with stage 1 through stage 4 chronic kidney disease, or unspecified chronic kidney disease: Secondary | ICD-10-CM | POA: Diagnosis present

## 2021-12-10 DIAGNOSIS — R531 Weakness: Secondary | ICD-10-CM | POA: Diagnosis not present

## 2021-12-10 DIAGNOSIS — F17210 Nicotine dependence, cigarettes, uncomplicated: Secondary | ICD-10-CM | POA: Diagnosis present

## 2021-12-10 DIAGNOSIS — J329 Chronic sinusitis, unspecified: Secondary | ICD-10-CM | POA: Diagnosis not present

## 2021-12-10 DIAGNOSIS — I739 Peripheral vascular disease, unspecified: Secondary | ICD-10-CM | POA: Diagnosis not present

## 2021-12-10 DIAGNOSIS — Z823 Family history of stroke: Secondary | ICD-10-CM | POA: Diagnosis not present

## 2021-12-10 DIAGNOSIS — S0083XS Contusion of other part of head, sequela: Secondary | ICD-10-CM | POA: Diagnosis not present

## 2021-12-10 DIAGNOSIS — N189 Chronic kidney disease, unspecified: Secondary | ICD-10-CM | POA: Diagnosis not present

## 2021-12-10 DIAGNOSIS — S0510XA Contusion of eyeball and orbital tissues, unspecified eye, initial encounter: Secondary | ICD-10-CM | POA: Diagnosis present

## 2021-12-10 DIAGNOSIS — J439 Emphysema, unspecified: Secondary | ICD-10-CM | POA: Diagnosis not present

## 2021-12-10 DIAGNOSIS — Z7983 Long term (current) use of bisphosphonates: Secondary | ICD-10-CM

## 2021-12-10 DIAGNOSIS — Z79899 Other long term (current) drug therapy: Secondary | ICD-10-CM

## 2021-12-10 DIAGNOSIS — W19XXXA Unspecified fall, initial encounter: Secondary | ICD-10-CM

## 2021-12-10 DIAGNOSIS — S0990XA Unspecified injury of head, initial encounter: Secondary | ICD-10-CM | POA: Diagnosis not present

## 2021-12-10 LAB — CBC WITH DIFFERENTIAL/PLATELET
Abs Immature Granulocytes: 1.65 10*3/uL — ABNORMAL HIGH (ref 0.00–0.07)
Basophils Absolute: 0.1 10*3/uL (ref 0.0–0.1)
Basophils Relative: 0 %
Eosinophils Absolute: 0 10*3/uL (ref 0.0–0.5)
Eosinophils Relative: 0 %
HCT: 35.7 % — ABNORMAL LOW (ref 36.0–46.0)
Hemoglobin: 11.4 g/dL — ABNORMAL LOW (ref 12.0–15.0)
Immature Granulocytes: 4 %
Lymphocytes Relative: 1 %
Lymphs Abs: 0.5 10*3/uL — ABNORMAL LOW (ref 0.7–4.0)
MCH: 34.1 pg — ABNORMAL HIGH (ref 26.0–34.0)
MCHC: 31.9 g/dL (ref 30.0–36.0)
MCV: 106.9 fL — ABNORMAL HIGH (ref 80.0–100.0)
Monocytes Absolute: 2.1 10*3/uL — ABNORMAL HIGH (ref 0.1–1.0)
Monocytes Relative: 5 %
Neutro Abs: 36.6 10*3/uL — ABNORMAL HIGH (ref 1.7–7.7)
Neutrophils Relative %: 90 %
Platelets: 204 10*3/uL (ref 150–400)
RBC: 3.34 MIL/uL — ABNORMAL LOW (ref 3.87–5.11)
RDW: 14.7 % (ref 11.5–15.5)
Smear Review: NORMAL
WBC: 40.9 10*3/uL — ABNORMAL HIGH (ref 4.0–10.5)
nRBC: 0 % (ref 0.0–0.2)

## 2021-12-10 LAB — CBC
HCT: 35.4 % — ABNORMAL LOW (ref 36.0–46.0)
Hemoglobin: 11.3 g/dL — ABNORMAL LOW (ref 12.0–15.0)
MCH: 34.3 pg — ABNORMAL HIGH (ref 26.0–34.0)
MCHC: 31.9 g/dL (ref 30.0–36.0)
MCV: 107.6 fL — ABNORMAL HIGH (ref 80.0–100.0)
Platelets: 206 10*3/uL (ref 150–400)
RBC: 3.29 MIL/uL — ABNORMAL LOW (ref 3.87–5.11)
RDW: 14.6 % (ref 11.5–15.5)
WBC: 42 10*3/uL — ABNORMAL HIGH (ref 4.0–10.5)
nRBC: 0 % (ref 0.0–0.2)

## 2021-12-10 LAB — BASIC METABOLIC PANEL
Anion gap: 9 (ref 5–15)
BUN: 33 mg/dL — ABNORMAL HIGH (ref 8–23)
CO2: 19 mmol/L — ABNORMAL LOW (ref 22–32)
Calcium: 8.5 mg/dL — ABNORMAL LOW (ref 8.9–10.3)
Chloride: 109 mmol/L (ref 98–111)
Creatinine, Ser: 2.51 mg/dL — ABNORMAL HIGH (ref 0.44–1.00)
GFR, Estimated: 18 mL/min — ABNORMAL LOW (ref >60)
Glucose, Bld: 155 mg/dL — ABNORMAL HIGH (ref 70–99)
Potassium: 4.8 mmol/L (ref 3.5–5.1)
Sodium: 137 mmol/L (ref 135–145)

## 2021-12-10 LAB — LACTIC ACID, PLASMA
Lactic Acid, Venous: 2.5 mmol/L (ref 0.5–1.9)
Lactic Acid, Venous: 2.4 mmol/L (ref 0.5–1.9)

## 2021-12-10 LAB — PROTIME-INR
INR: 1.1 (ref 0.8–1.2)
Prothrombin Time: 14.2 seconds (ref 11.4–15.2)

## 2021-12-10 LAB — APTT: aPTT: 33 seconds (ref 24–36)

## 2021-12-10 LAB — PROCALCITONIN: Procalcitonin: 7.44 ng/mL

## 2021-12-10 MED ORDER — ASCORBIC ACID 500 MG PO TABS
500.0000 mg | ORAL_TABLET | Freq: Every day | ORAL | Status: DC
  Administered 2021-12-10 – 2021-12-12 (×3): 500 mg via ORAL
  Filled 2021-12-10 (×3): qty 1

## 2021-12-10 MED ORDER — SODIUM CHLORIDE 0.9 % IV SOLN
500.0000 mg | INTRAVENOUS | Status: DC
  Administered 2021-12-11 – 2021-12-12 (×2): 500 mg via INTRAVENOUS
  Filled 2021-12-10: qty 5
  Filled 2021-12-10: qty 500

## 2021-12-10 MED ORDER — SODIUM CHLORIDE 0.9 % IV SOLN
1.0000 g | Freq: Once | INTRAVENOUS | Status: AC
  Administered 2021-12-10: 1 g via INTRAVENOUS
  Filled 2021-12-10: qty 10

## 2021-12-10 MED ORDER — ZINC SULFATE 220 (50 ZN) MG PO CAPS
220.0000 mg | ORAL_CAPSULE | Freq: Every day | ORAL | Status: DC
  Administered 2021-12-11 – 2021-12-12 (×2): 220 mg via ORAL
  Filled 2021-12-10 (×2): qty 1

## 2021-12-10 MED ORDER — GUAIFENESIN-DM 100-10 MG/5ML PO SYRP
10.0000 mL | ORAL_SOLUTION | Freq: Three times a day (TID) | ORAL | Status: DC | PRN
  Administered 2021-12-11: 10 mL via ORAL
  Filled 2021-12-10: qty 10

## 2021-12-10 MED ORDER — SODIUM CHLORIDE 0.9 % IV SOLN: INTRAVENOUS | Status: AC

## 2021-12-10 MED ORDER — HYDROCOD POLI-CHLORPHE POLI ER 10-8 MG/5ML PO SUER: 5.0000 mL | Freq: Every evening | ORAL | Status: DC | PRN

## 2021-12-10 MED ORDER — MIRTAZAPINE 15 MG PO TABS
30.0000 mg | ORAL_TABLET | Freq: Every day | ORAL | Status: DC
  Administered 2021-12-10 – 2021-12-11 (×2): 30 mg via ORAL
  Filled 2021-12-10 (×2): qty 2

## 2021-12-10 MED ORDER — HEPARIN SODIUM (PORCINE) 5000 UNIT/ML IJ SOLN
5000.0000 [IU] | Freq: Three times a day (TID) | INTRAMUSCULAR | Status: DC
  Administered 2021-12-10 – 2021-12-12 (×5): 5000 [IU] via SUBCUTANEOUS
  Filled 2021-12-10 (×5): qty 1

## 2021-12-10 MED ORDER — SODIUM CHLORIDE 0.9 % IV SOLN
1.0000 g | Freq: Once | INTRAVENOUS | Status: AC
  Administered 2021-12-10: 1 g via INTRAVENOUS
  Filled 2021-12-10 (×2): qty 10

## 2021-12-10 MED ORDER — ONDANSETRON HCL 4 MG/2ML IJ SOLN
4.0000 mg | Freq: Four times a day (QID) | INTRAMUSCULAR | Status: DC | PRN
Start: 2021-12-10 — End: 2021-12-12

## 2021-12-10 MED ORDER — ASPIRIN-DIPYRIDAMOLE ER 25-200 MG PO CP12
1.0000 | ORAL_CAPSULE | Freq: Two times a day (BID) | ORAL | Status: DC
  Administered 2021-12-11 – 2021-12-12 (×4): 1 via ORAL
  Filled 2021-12-10 (×5): qty 1

## 2021-12-10 MED ORDER — ONDANSETRON HCL 4 MG PO TABS: 4.0000 mg | ORAL_TABLET | Freq: Four times a day (QID) | ORAL | Status: DC | PRN

## 2021-12-10 MED ORDER — MIDODRINE HCL 5 MG PO TABS
5.0000 mg | ORAL_TABLET | Freq: Once | ORAL | Status: DC | PRN
Start: 2021-12-10 — End: 2021-12-12

## 2021-12-10 MED ORDER — SODIUM CHLORIDE 0.9 % IV BOLUS
1000.0000 mL | Freq: Once | INTRAVENOUS | Status: AC
  Administered 2021-12-10: 1000 mL via INTRAVENOUS

## 2021-12-10 MED ORDER — ACETAMINOPHEN 650 MG RE SUPP: 650.0000 mg | Freq: Four times a day (QID) | RECTAL | Status: DC | PRN

## 2021-12-10 MED ORDER — PANTOPRAZOLE SODIUM 40 MG PO TBEC
40.0000 mg | DELAYED_RELEASE_TABLET | Freq: Two times a day (BID) | ORAL | Status: DC
  Administered 2021-12-10 – 2021-12-12 (×4): 40 mg via ORAL
  Filled 2021-12-10 (×4): qty 1

## 2021-12-10 MED ORDER — GUAIFENESIN-DM 100-10 MG/5ML PO SYRP: 10.0000 mL | ORAL_SOLUTION | ORAL | Status: DC | PRN

## 2021-12-10 MED ORDER — SODIUM CHLORIDE 0.9 % IV BOLUS
500.0000 mL | Freq: Once | INTRAVENOUS | Status: AC
  Administered 2021-12-10: 500 mL via INTRAVENOUS

## 2021-12-10 MED ORDER — SODIUM CHLORIDE 0.9 % IV SOLN
500.0000 mg | Freq: Once | INTRAVENOUS | Status: AC
  Administered 2021-12-10: 500 mg via INTRAVENOUS
  Filled 2021-12-10: qty 5

## 2021-12-10 MED ORDER — SODIUM CHLORIDE 0.9 % IV BOLUS (SEPSIS): 1000.0000 mL | Freq: Once | INTRAVENOUS | Status: DC

## 2021-12-10 MED ORDER — SENNOSIDES-DOCUSATE SODIUM 8.6-50 MG PO TABS: 1.0000 | ORAL_TABLET | Freq: Every evening | ORAL | Status: DC | PRN

## 2021-12-10 MED ORDER — ACETAMINOPHEN 325 MG PO TABS: 650.0000 mg | ORAL_TABLET | Freq: Four times a day (QID) | ORAL | Status: DC | PRN

## 2021-12-10 MED ORDER — SODIUM CHLORIDE 0.9 % IV SOLN
2.0000 g | INTRAVENOUS | Status: DC
  Administered 2021-12-11 – 2021-12-12 (×2): 2 g via INTRAVENOUS
  Filled 2021-12-10 (×2): qty 20

## 2021-12-10 NOTE — ED Notes (Signed)
Informed RN bed assigned 

## 2021-12-10 NOTE — Assessment & Plan Note (Signed)
-   Presumed secondary to pneumonia and meeting sepsis criteria - Fall precautions ordered

## 2021-12-10 NOTE — Assessment & Plan Note (Signed)
-   Resumed home Aggrenox 200-25 mg p.o. every 12 hours for secondary stroke prevention - Patient is no longer on statin medication

## 2021-12-10 NOTE — H&P (Addendum)
History and Physical   Carolyn Kline GUR:[id number] DOB: Apr 01, 1929 DOA: 12/10/2021  PCP: Leonel Ramsay, MD  Outpatient Specialists: Dr. Percell Boston, [facility]` Patient coming from: [place] [facility] via [place]  I have personally briefly reviewed patient's old medical records in [place].  Chief Concern: Falling  HPI: Ms. Carolyn Kline is a 88 year old female with history of esophageal dysmotility found to have esophageal stenosis status postdilatation, hypertension, history of stroke, left lower lobe nodule, rheumatoid arthritis on Plaquenil and previously on Humira, current tobacco use of half a pack per day, CKD stage IIIb, who presents to the emergency department for chief concerns of falling on 12/07/2021.  Initial vitals in the emergency department showed temperature 98.6, respiration rate of 20, heart rate of 91, blood pressure 82/47 and improved to 101/58, SPO2 of 98% on room air.  Serum sodium is 137, potassium 4.8, chloride 109, bicarb 19, BUN of 33, serum creatinine of 2.51, nonfasting blood glucose 155, WBC elevated about 42, hemoglobin 11.3, platelets of 206.  Procalcitonin is pending.  UA is pending collection.  Blood cultures x2 have been ordered and are in process.  First lactic acid is in process.  ED treatment: Ceftriaxone 1 g daily azithromycin 500 mg, sodium chloride 1 L bolus. ------------------- At bedside patient is able to tell me her full name, her age, the current calendar year and that she is in the hospital.  She is able to identify her daughter at bedside.  Per daughter, patient fell on 11/08/2021 trying to close a door.  She did not have her walker with her.  At baseline patient is mostly bedbound and stationary in the chair.  She is ambulatory with a rollator walker whenever she wants to go outside.  Daughter reports that she did not bring her mother into the hospital sooner because her mother refused.  Daughter endorses that  patient has been coughing for about a month.  The cough has been nonproductive.  Patient improved minimally with outpatient over-the-counter Robitussin DM however after about 1 week the cough resumed.  Patient denies known fever, nausea, vomiting, chest pain, shortness of breath, abdominal pain, dysuria, hematuria, diarrhea, loss of consciousness.  She reports that when she fell she was on the floor for maybe about 2 or 3 minutes and then she got up on her own.  Social history: She lives at home with her daughter. She is a current tobacco user smoking half a pack per day.  She has been smoking for 60 years.  She is not ready to quit.  She denies EtOH and recreational drug use.  She is retired and formerly worked in the [employer]: Constitutional: no weight change, no fever ENT/Mouth: no sore throat, no rhinorrhea Eyes: no eye pain, no vision changes Cardiovascular: no chest pain, no dyspnea,  no edema, no palpitations Respiratory: + cough, no sputum, no wheezing Gastrointestinal: no nausea, no vomiting, no diarrhea, no constipation Genitourinary: no urinary incontinence, no dysuria, no hematuria Musculoskeletal: no arthralgias, no myalgias Skin: no skin lesions, no pruritus, Neuro: + weakness, no loss of consciousness, no syncope Psych: no anxiety, no depression, no decrease appetite Heme/Lymph: no bruising, no bleeding  ED Course: Discussed with emergency medicine provider, patient requiring hospitalization for chief concerns of pneumonia.  Assessment/Plan  Principal Problem:   Sepsis due to pneumonia [facility]) Active Problems:   History of stroke   Essential hypertension   Community acquired pneumonia   RA (rheumatoid arthritis) ([place])   AKI (  acute kidney injury) ([place])   Traumatic ecchymosis of orbital rim, initial encounter   Traumatic ecchymosis of forehead   Fall at home, initial encounter   Assessment and Plan:  * Sepsis due to pneumonia ([place]) - Severe sepsis with  initial hypotension responsive to IV fluid bolus with renal involvement - First Lactic acid elevated at 2.5 with elevated procalcitonin at 7.44 - Patient presented with hypotension heart rate of 82/47 (MAP of 59) responded to IV fluid bolus, markedly acute leukocytosis of 42 - Ordered additional sodium chloride 500 mL bolus and sodium IVF at 150 mL/h, 20 hours - Blood cultures x2 are in process - UA is pending collection - Added urine culture for collection - Admit to telemetry cardiac, observation  Fall at home, initial encounter - Presumed secondary to pneumonia and meeting sepsis criteria - Fall precautions ordered  Traumatic ecchymosis of forehead - Present on admission secondary to mechanical fall at home  Traumatic ecchymosis of orbital rim, initial encounter - Bilateral right greater than left - Present on admission; secondary to mechanical fall at home on 12/08/2021  AKI (acute kidney injury) ([place]) - On CKD 3B - Status post 1 sodium chloride l bolus - Added additional sodium chloride 1 L bolus - Sodium chloride IVF at 150 mL/h, 20 hours ordered - BMP in a.m.  RA (rheumatoid arthritis) ([place]) - Patient is currently on Humira  Community acquired pneumonia - Status post ceftriaxone 1 g IV on admission and azithromycin 500 mg IV per EDP - I ordered additional ceftriaxone to complete 2 g on admission - We will continue with ceftriaxone 2 g IV daily and azithromycin 500 mg IV daily starting on 12/11/2021 for an additional 4 doses each - Encourage expectorant during the day and suppression of cough at night to encourage healthy sleep for healing - Robitussin DM 10 mg p.o. 3 times daily as needed for cough; Tussionex nightly as needed for cough - Admit to telemetry cardiac, observation  Essential hypertension - Per med reconciliation patient is no longer taking antihypertensive medications  History of stroke - Resumed home Aggrenox 200-25 mg p.o. every 12 hours for secondary  stroke prevention - Patient is no longer on statin medication  Fall precautions  Chart reviewed.   DVT prophylaxis: Heparin 5000 units Code Status: Limited, patient does not want to be resuscitated with chest compressions or shock; patient is okay with intubation Diet: Heart healthy Family Communication: Updated daughter at bedside with patient's permission Disposition Plan: Pending clinical course Consults called: Pharmacy Admission status: Telemetry cardiac, observation  Past Medical History:  Diagnosis Date   CVA (cerebral infarction)    Hyperlipemia    Hypertension    Stroke [facility])    Past Surgical History:  Procedure Laterality Date   ESOPHAGOGASTRODUODENOSCOPY N/A 01/15/2021   Procedure: ESOPHAGOGASTRODUODENOSCOPY (EGD);  Surgeon: [name], DO;  Location: [facility] ENDOSCOPY;  Service: Gastroenterology;  Laterality: N/A;   EYE SURGERY Bilateral 01/2020   Eyelid Surgery   Social History:  reports that she has been smoking cigarettes. She has a 54.00 pack-year smoking history. She has never used smokeless tobacco. She reports that she does not drink alcohol and does not use drugs.  No Known Allergies Family History  Problem Relation Age of Onset   Stroke Mother    Family history: Family history reviewed and not pertinent  Prior to Admission medications   Medication Sig Start Date End Date Taking? Authorizing Provider  dipyridamole-aspirin (AGGRENOX) 200-25 MG 12hr capsule Take 1 capsule by mouth  2 (two) times daily.   Yes [provider]  mirtazapine (REMERON) 30 MG tablet Take 30 mg by mouth at bedtime.   Yes [provider]  pantoprazole (PROTONIX) 40 MG tablet Take 40 mg by mouth 2 (two) times daily. 11/06/21  Yes [provider]  zinc sulfate 220 (50 Zn) MG capsule Take 1 capsule (220 mg total) by mouth daily. 07/15/19  Yes Dhungel, Nishant, MD  alendronate (FOSAMAX) 70 MG tablet Take 70 mg by mouth once a week. 11/28/21   [provider]  ascorbic acid (VITAMIN C) 500 MG tablet Take 1 tablet (500 mg total) by mouth daily. Patient not taking: Reported on 12/10/2021 07/15/19   Dhungel, Flonnie Overman, MD  calcium carbonate (OSCAL) 1500 (600 CA) MG TABS tablet Take by mouth 2 (two) times daily with a meal. Patient not taking: Reported on 12/10/2021    [provider]  chlorpheniramine-HYDROcodone (TUSSIONEX) 10-8 MG/5ML SUER Take 5 mLs by mouth every 12 (twelve) hours as needed for cough. Patient not taking: Reported on 01/11/2021 07/15/19   Dhungel, Flonnie Overman, MD  guaiFENesin-dextromethorphan (ROBITUSSIN DM) 100-10 MG/5ML syrup Take 10 mLs by mouth every 4 (four) hours as needed for cough. Patient not taking: Reported on 01/11/2021 07/15/19   Dhungel, Flonnie Overman, MD  simvastatin (ZOCOR) 40 MG tablet Take 40 mg by mouth daily. Patient not taking: Reported on 01/11/2021    [provider]  vitamin B-12 (CYANOCOBALAMIN) 1000 MCG tablet Take 1 tablet (1,000 mcg total) by mouth daily. Patient not taking: Reported on 12/10/2021 07/17/19   Debbe Odea, MD   Physical Exam: Vitals:   12/10/21 1057 12/10/21 1059 12/10/21 1212  BP:  (!) 82/47 (!) 101/58  Pulse:  91 70  Resp:  20 16  Temp:  98.6 F (37 C)   TempSrc:  Oral   SpO2:  98% 100%  Weight: 42.6 kg    Height: '5\' 5"'$  (1.651 m)     Constitutional: appears age-appropriate, frail, NAD, calm, comfortable Eyes: PERRL, lids and conjunctivae normal ENMT: Mucous membranes are moist. Posterior pharynx clear of any exudate or lesions. Age-appropriate dentition. Hearing appropriate Neck: normal, supple, no masses, no thyromegaly Respiratory: clear to auscultation bilaterally, no wheezing, no crackles. Normal respiratory effort. No accessory muscle use.  Cardiovascular: Regular rate and rhythm, no murmurs / rubs / gallops. No extremity edema. 2+ pedal pulses. No carotid bruits.  Abdomen: no tenderness, no masses palpated, no hepatosplenomegaly. Bowel sounds positive.   Musculoskeletal: no clubbing / cyanosis. No joint deformity upper and lower extremities. Good ROM, no contractures, no atrophy. Normal muscle tone.  Skin: no rashes, lesions, ulcers. No induration.  Bilateral orbital ecchymosis and forehead ecchymosis Neurologic: Sensation intact. Strength 5/5 in all 4.  Psychiatric: Normal judgment and insight. Alert and oriented x 3. Normal mood.   EKG: independently reviewed, showing sinus rhythm with rate of 92, QTc 467  Chest x-ray on Admission: I personally reviewed and I agree with radiologist reading as below.  DG Chest 2 View  Result Date: 12/10/2021 CLINICAL DATA:  Weakness, mechanical fall EXAM: CHEST - 2 VIEW COMPARISON:  CT chest, 03/06/2021 FINDINGS: The heart size and mediastinal contours are within normal limits. Heterogeneous airspace opacity of the peripheral right upper lobe superimposed upon emphysema. The visualized skeletal structures are unremarkable. IMPRESSION: 1. Heterogeneous airspace opacity of the peripheral right upper lobe, superimposed upon emphysema, concerning for infection. Recommend follow-up radiographs in 6-8 weeks to ensure complete radiographic resolution and exclude underlying mass. 2. Emphysema. Electronically Signed  By: Delanna Ahmadi M.D.   On: 12/10/2021 13:05   CT Cervical Spine Wo Contrast  Result Date: 12/10/2021 CLINICAL DATA:  Trauma, fall EXAM: CT CERVICAL SPINE WITHOUT CONTRAST TECHNIQUE: Multidetector CT imaging of the cervical spine was performed without intravenous contrast. Multiplanar CT image reconstructions were also generated. RADIATION DOSE REDUCTION: This exam was performed according to the departmental dose-optimization program which includes automated exposure control, adjustment of the mA and/or kV according to patient size and/or use of iterative reconstruction technique. COMPARISON:  None Available. FINDINGS: Alignment: Alignment of the posterior margins of vertebral bodies is unremarkable. There is  reversal of lordosis. Skull base and vertebrae: No recent fracture is seen. Degenerative changes are noted. Soft tissues and spinal canal: There is extrinsic pressure over the ventral margin of thecal sac caused by posterior bony spurs with mild-to-moderate spinal stenosis at C3-C4 level. There is mild spinal stenosis at C4-C7 levels. Disc levels: There is encroachment of neural foramina from C2 to T2 levels. Upper chest: There is pleural thickening in both apices with pleural calcifications suggesting scarring. Other: Thyroid is enlarged with inhomogeneous attenuation. IMPRESSION: No recent fracture is seen. Osteopenia. Cervical spondylosis with spinal stenosis and encroachment of neural foramina at multiple levels as described in the body of the report. Electronically Signed   By: Elmer Picker M.D.   On: 12/10/2021 12:02   CT HEAD WO CONTRAST  Result Date: 12/10/2021 CLINICAL DATA:  Trauma, fall EXAM: CT HEAD WITHOUT CONTRAST TECHNIQUE: Contiguous axial images were obtained from the base of the skull through the vertex without intravenous contrast. RADIATION DOSE REDUCTION: This exam was performed according to the departmental dose-optimization program which includes automated exposure control, adjustment of the mA and/or kV according to patient size and/or use of iterative reconstruction technique. COMPARISON:  05/28/2014 FINDINGS: Brain: No acute intracranial findings are seen. There are no signs of bleeding within the cranium. Cortical sulci are prominent. There is decreased density in periventricular and subcortical white matter. There is possible old infarct in the right frontal lobe. Vascular: Scattered arterial calcifications are seen. Skull: No fracture is seen in the calvarium. Sinuses/Orbits: There is mucosal thickening in ethmoid and left maxillary sinuses. Effusions are seen in view of mastoid air cells on both sides. Other: None. IMPRESSION: No acute intracranial findings are seen in  noncontrast CT brain. Atrophy. Small-vessel disease. Chronic sinusitis.  Bilateral mastoid effusions. Electronically Signed   By: Elmer Picker M.D.   On: 12/10/2021 11:54    Labs on Admission: I have personally reviewed following labs  CBC: Recent Labs  Lab 12/10/21 1109  WBC 40.9*  42.0*  NEUTROABS 36.6*  HGB 11.4*  11.3*  HCT 35.7*  35.4*  MCV 106.9*  107.6*  PLT 204  016   Basic Metabolic Panel: Recent Labs  Lab 12/10/21 1109  NA 137  K 4.8  CL 109  CO2 19*  GLUCOSE 155*  BUN 33*  CREATININE 2.51*  CALCIUM 8.5*   GFR: Estimated Creatinine Clearance: 10 mL/min (A) (by C-G formula based on SCr of 2.51 mg/dL (H)).  Urine analysis:    Component Value Date/Time   COLORURINE YELLOW (A) 01/09/2021 1439   APPEARANCEUR CLOUDY (A) 01/09/2021 1439   APPEARANCEUR Cloudy 05/28/2014 1720   LABSPEC 1.017 01/09/2021 1439   LABSPEC 1.013 05/28/2014 1720   PHURINE 5.0 01/09/2021 1439   GLUCOSEU NEGATIVE 01/09/2021 1439   GLUCOSEU Negative 05/28/2014 1720   HGBUR NEGATIVE 01/09/2021 1439   BILIRUBINUR NEGATIVE 01/09/2021 1439   BILIRUBINUR Negative  05/28/2014 1720   [place] 01/09/2021 1439   PROTEINUR 30 (A) 01/09/2021 1439   NITRITE NEGATIVE 01/09/2021 1439   LEUKOCYTESUR LARGE (A) 01/09/2021 1439   LEUKOCYTESUR Trace 05/28/2014 1720   CRITICAL CARE Performed by: Dr. Tobie Poet  Total critical care time: 35 minutes  Critical care time was exclusive of separately billable procedures and treating other patients.  Critical care was necessary to treat or prevent imminent or life-threatening deterioration.  Critical care was time spent personally by me on the following activities: development of treatment plan with patient and/or surrogate as well as nursing, discussions with consultants, evaluation of patient's response to treatment, examination of patient, obtaining history from patient or surrogate, ordering and performing treatments and interventions,  ordering and review of laboratory studies, ordering and review of radiographic studies, pulse oximetry and re-evaluation of patient's condition.  Dr. Tobie Poet [facility]  If 7PM-7AM, please contact overnight-coverage provider If 7AM-7PM, please contact day coverage provider [email or web address]  12/10/2021, 3:07 PM

## 2021-12-10 NOTE — ED Provider Notes (Signed)
[facility] Provider Note    Event Date/Time   First MD Initiated Contact with Patient 12/10/21 1129     (approximate)   History   Chief Complaint Fall   HPI  Carolyn Kline is a 88 y.o. female with past medical history of hypertension, hyperlipidemia, stroke, and rheumatoid arthritis who presents to the ED complaining of fall.  Patient admits to falling at home 3 days ago, striking her head.  She is unsure what caused her to fall, cannot say whether she tripped or something but denies losing consciousness.  Daughter became concerned when patient developed increased bruising around her face over the past 2 days and she also takes Aggrenox.  Patient was brought to her PCPs office today and referred to the ED for further evaluation.  Patient currently denies any headache, neck pain, chest pain, abdominal pain, or extremity pain. Patient states she has otherwise been feeling well with no fevers, dysuria, nausea, vomiting, or diarrhea, but she does report recent cough.      Physical Exam   Triage Vital Signs: ED Triage Vitals  Enc Vitals Group     BP 12/10/21 1059 (!) 82/47     Pulse Rate 12/10/21 1059 91     Resp 12/10/21 1059 20     Temp 12/10/21 1059 98.6 F (37 C)     Temp Source 12/10/21 1059 Oral     SpO2 12/10/21 1059 98 %     Weight 12/10/21 1057 94 lb (42.6 kg)     Height 12/10/21 1057 '5\' 5"'$  (1.651 m)     Head Circumference --      Peak Flow --      Pain Score 12/10/21 1057 0     Pain Loc --      Pain Edu? --      Excl. in [place]? --     Most recent vital signs: Vitals:   12/10/21 1059 12/10/21 1212  BP: (!) 82/47 (!) 101/58  Pulse: 91 70  Resp: 20 16  Temp: 98.6 F (37 C)   SpO2: 98% 100%    Constitutional: Alert and oriented. Eyes: Conjunctivae are normal. Head: Periorbital ecchymosis without significant edema.  No facial bony tenderness or step-offs noted. Nose: No congestion/rhinnorhea. Mouth/Throat: Mucous membranes are moist.   Neck: No midline cervical spine tenderness to palpation. Cardiovascular: Normal rate, regular rhythm. Grossly normal heart sounds.  2+ radial pulses bilaterally. Respiratory: Normal respiratory effort.  No retractions. Lungs CTAB. Gastrointestinal: Soft and nontender. No distention. Musculoskeletal: No lower extremity tenderness nor edema.  No upper extremity bony tenderness to palpation. Neurologic:  Normal speech and language. No gross focal neurologic deficits are appreciated.    ED Results / Procedures / Treatments   Labs (all labs ordered are listed, but only abnormal results are displayed) Labs Reviewed  BASIC METABOLIC PANEL - Abnormal; Notable for the following components:      Result Value   CO2 19 (*)    Glucose, Bld 155 (*)    BUN 33 (*)    Creatinine, Ser 2.51 (*)    Calcium 8.5 (*)    GFR, Estimated 18 (*)    All other components within normal limits  CBC - Abnormal; Notable for the following components:   WBC 42.0 (*)    RBC 3.29 (*)    Hemoglobin 11.3 (*)    HCT 35.4 (*)    MCV 107.6 (*)    MCH 34.3 (*)    All other components  within normal limits  CBC WITH DIFFERENTIAL/PLATELET - Abnormal; Notable for the following components:   WBC 40.9 (*)    RBC 3.34 (*)    Hemoglobin 11.4 (*)    HCT 35.7 (*)    MCV 106.9 (*)    MCH 34.1 (*)    Neutro Abs 36.6 (*)    Lymphs Abs 0.5 (*)    Monocytes Absolute 2.1 (*)    Abs Immature Granulocytes 1.65 (*)    All other components within normal limits  CULTURE, BLOOD (ROUTINE X 2)  CULTURE, BLOOD (ROUTINE X 2)  PROCALCITONIN  URINALYSIS, ROUTINE W REFLEX MICROSCOPIC  LACTIC ACID, PLASMA  LACTIC ACID, PLASMA  CREATININE, SERUM  CBG MONITORING, ED     EKG  ED ECG REPORT I, Blake Divine, the attending physician, personally viewed and interpreted this ECG.   Date: 12/10/2021  EKG Time: 11:01  Rate: 92  Rhythm: normal sinus rhythm  Axis: Normal  Intervals: Incomplete RBBB  ST&T Change:  None  RADIOLOGY CT head reviewed and interpreted by me with no hemorrhage or midline shift.  CT cervical spine reviewed and interpreted by me with no fracture or dislocation.  PROCEDURES:  Critical Care performed: Yes, see critical care procedure note(s)  .Critical Care  Performed by: Blake Divine, MD Authorized by: Blake Divine, MD   Critical care provider statement:    Critical care time (minutes):  30   Critical care time was exclusive of:  Separately billable procedures and treating other patients and teaching time   Critical care was necessary to treat or prevent imminent or life-threatening deterioration of the following conditions:  Sepsis   Critical care was time spent personally by me on the following activities:  Development of treatment plan with patient or surrogate, discussions with consultants, evaluation of patient's response to treatment, examination of patient, ordering and review of laboratory studies, ordering and review of radiographic studies, ordering and performing treatments and interventions, pulse oximetry, re-evaluation of patient's condition and review of old charts   I assumed direction of critical care for this patient from another provider in my specialty: no     Care discussed with: admitting provider      MEDICATIONS ORDERED IN ED: Medications  cefTRIAXone (ROCEPHIN) 1 g in sodium chloride 0.9 % 100 mL IVPB (1 g Intravenous New Bag/Given 12/10/21 1340)  azithromycin (ZITHROMAX) 500 mg in sodium chloride 0.9 % 250 mL IVPB (has no administration in time range)  mirtazapine (REMERON) tablet 30 mg (has no administration in time range)  ascorbic acid (VITAMIN C) tablet 500 mg (has no administration in time range)  zinc sulfate capsule 220 mg (has no administration in time range)  guaiFENesin-dextromethorphan (ROBITUSSIN DM) 100-10 MG/5ML syrup 10 mL (has no administration in time range)  chlorpheniramine-HYDROcodone (TUSSIONEX) 10-8 MG/5ML suspension 5  mL (has no administration in time range)  acetaminophen (TYLENOL) tablet 650 mg (has no administration in time range)    Or  acetaminophen (TYLENOL) suppository 650 mg (has no administration in time range)  ondansetron (ZOFRAN) tablet 4 mg (has no administration in time range)    Or  ondansetron (ZOFRAN) injection 4 mg (has no administration in time range)  heparin injection 5,000 Units (has no administration in time range)  senna-docusate (Senokot-S) tablet 1 tablet (has no administration in time range)  sodium chloride 0.9 % bolus 1,000 mL (0 mLs Intravenous Stopped 12/10/21 1212)     IMPRESSION / MDM / [place] / ED COURSE  I reviewed the triage  vital signs and the nursing notes.                              88 y.o. female with past medical history of hypertension, hyperlipidemia, stroke, and rheumatoid arthritis who presents to the ED complaining of fall 2 days ago striking her head.  Patient's presentation is most consistent with acute presentation with potential threat to life or bodily function.  Differential diagnosis includes, but is not limited to, intracranial hemorrhage, cervical spine injury, facial contusion, sepsis, pneumonia, UTI, electrolyte abnormality, AKI.  Patient well-appearing and in no acute distress, vital signs remarkable for hypotension initially although this seems to be improving with IV fluid bolus.  CT head and cervical spine are negative for acute traumatic injury, however labs are concerning for significant leukocytosis.  This appears to be a significant change even from just a couple of weeks ago with neutrophil predominance.  She has not been on any recent systemic steroids, but did receive steroid injection to her knee about 2 weeks ago, however I doubt this would account for her significantly elevated white count.  She is immunocompromised on Humira and we will initiate sepsis work-up with blood cultures, lactate, chest x-ray, and urinalysis.   Labs additionally remarkable for mild AKI with no significant electrolyte abnormality.  Chest x-ray is concerning for pneumonia, likely contributing to her markedly elevated WBC.  Septic work-up otherwise reassuring with normal lactic acid, blood cultures pending.  Patient started on Rocephin and azithromycin for community-acquired pneumonia, case discussed with hospitalist for admission.      FINAL CLINICAL IMPRESSION(S) / ED DIAGNOSES   Final diagnoses:  Community acquired pneumonia of right upper lobe of lung  Sepsis without acute organ dysfunction, due to unspecified organism [facility])     Rx / DC Orders   ED Discharge Orders     None        Note:  This document was prepared using Dragon voice recognition software and may include unintentional dictation errors.   Blake Divine, MD 12/10/21 1348

## 2021-12-10 NOTE — Code Documentation (Signed)
CODE SEPSIS - PHARMACY COMMUNICATION  **Broad Spectrum Antibiotics should be administered within 1 hour of Sepsis diagnosis**  Time Code Sepsis Called/Page Received: 1400  Antibiotics Ordered: Ceftriaxone, Azithromycin   Time of 1st antibiotic administration: [place] ,PharmD Clinical Pharmacist  12/10/2021  2:16 PM

## 2021-12-10 NOTE — Assessment & Plan Note (Signed)
-   On CKD 3B - Status post 1 sodium chloride l bolus - Added additional sodium chloride 1 L bolus - Sodium chloride IVF at 150 mL/h, 20 hours ordered - BMP in a.m.

## 2021-12-10 NOTE — Progress Notes (Signed)
Notified bedside nurse of need to draw repeat lactic acid. 

## 2021-12-10 NOTE — Assessment & Plan Note (Addendum)
-   Patient is currently on Humira

## 2021-12-10 NOTE — Assessment & Plan Note (Signed)
-   Status post ceftriaxone 1 g IV on admission and azithromycin 500 mg IV per EDP - I ordered additional ceftriaxone to complete 2 g on admission - We will continue with ceftriaxone 2 g IV daily and azithromycin 500 mg IV daily starting on 12/11/2021 for an additional 4 doses each - Encourage expectorant during the day and suppression of cough at night to encourage healthy sleep for healing - Robitussin DM 10 mg p.o. 3 times daily as needed for cough; Tussionex nightly as needed for cough - Admit to telemetry cardiac, observation

## 2021-12-10 NOTE — Progress Notes (Signed)
Elink following code sepsis °

## 2021-12-10 NOTE — Assessment & Plan Note (Addendum)
-   Severe sepsis with initial hypotension responsive to IV fluid bolus with renal involvement - First Lactic acid elevated at 2.5 with elevated procalcitonin at 7.44 - Patient presented with hypotension heart rate of 82/47 (MAP of 59) responded to IV fluid bolus, markedly acute leukocytosis of 42 - Ordered additional sodium chloride 500 mL bolus and sodium IVF at 150 mL/h, 20 hours - Blood cultures x2 are in process - UA is pending collection - Added urine culture for collection - Admit to telemetry cardiac, observation

## 2021-12-10 NOTE — Assessment & Plan Note (Signed)
-   Per med reconciliation patient is no longer taking antihypertensive medications

## 2021-12-10 NOTE — Assessment & Plan Note (Signed)
-   Present on admission secondary to mechanical fall at home

## 2021-12-10 NOTE — Assessment & Plan Note (Addendum)
-   Bilateral right greater than left - Present on admission; secondary to mechanical fall at home on 12/08/2021

## 2021-12-10 NOTE — Hospital Course (Addendum)
Ms. Kresta Templeman is a 88 year old female with history of esophageal dysmotility found to have esophageal stenosis status postdilatation, hypertension, history of stroke, left lower lobe nodule, rheumatoid arthritis on Plaquenil and previously on Humira, current tobacco use of half a pack per day, CKD stage IIIb, who presents to the emergency department for chief concerns of falling on 12/07/2021. Patient was hypotensive, received 1 L of IV fluid bolus. Patient had a severe leukocytosis with white cell at 42, significant elevated procalcitonin level.  Chest x-ray showed right upper lobe pneumonia.  Patient was started on Rocephin and Zithromax.

## 2021-12-10 NOTE — ED Triage Notes (Signed)
Pt via POV from [facility]. Pt has a mechanical fall on Friday, pt did not have her walker with her and she fell face forward. States she is on blood thinners. Denies any LOC. Denies pain at this time. Pt is A&OX4 and NAD.

## 2021-12-10 NOTE — Progress Notes (Signed)
   12/10/21 1545  Clinical Encounter Type  Visited With Patient and family together  Visit Type Initial;Social support;Spiritual support   Chaplainm Maudell Stanbrough engaged pt and her daughter in hallway. Offered hospitality and supportive presence. Chaplain offered active listening and concern; visit seemed to be appreciated.

## 2021-12-11 DIAGNOSIS — R6520 Severe sepsis without septic shock: Secondary | ICD-10-CM | POA: Diagnosis present

## 2021-12-11 DIAGNOSIS — M069 Rheumatoid arthritis, unspecified: Secondary | ICD-10-CM | POA: Diagnosis present

## 2021-12-11 DIAGNOSIS — W1830XA Fall on same level, unspecified, initial encounter: Secondary | ICD-10-CM | POA: Diagnosis present

## 2021-12-11 DIAGNOSIS — E785 Hyperlipidemia, unspecified: Secondary | ICD-10-CM | POA: Diagnosis present

## 2021-12-11 DIAGNOSIS — N179 Acute kidney failure, unspecified: Secondary | ICD-10-CM | POA: Diagnosis not present

## 2021-12-11 DIAGNOSIS — W19XXXA Unspecified fall, initial encounter: Secondary | ICD-10-CM | POA: Diagnosis not present

## 2021-12-11 DIAGNOSIS — F17210 Nicotine dependence, cigarettes, uncomplicated: Secondary | ICD-10-CM | POA: Diagnosis present

## 2021-12-11 DIAGNOSIS — J189 Pneumonia, unspecified organism: Secondary | ICD-10-CM | POA: Diagnosis present

## 2021-12-11 DIAGNOSIS — Z7983 Long term (current) use of bisphosphonates: Secondary | ICD-10-CM | POA: Diagnosis not present

## 2021-12-11 DIAGNOSIS — I129 Hypertensive chronic kidney disease with stage 1 through stage 4 chronic kidney disease, or unspecified chronic kidney disease: Secondary | ICD-10-CM | POA: Diagnosis present

## 2021-12-11 DIAGNOSIS — Y92009 Unspecified place in unspecified non-institutional (private) residence as the place of occurrence of the external cause: Secondary | ICD-10-CM

## 2021-12-11 DIAGNOSIS — Z823 Family history of stroke: Secondary | ICD-10-CM | POA: Diagnosis not present

## 2021-12-11 DIAGNOSIS — S0083XA Contusion of other part of head, initial encounter: Secondary | ICD-10-CM | POA: Diagnosis present

## 2021-12-11 DIAGNOSIS — Z8673 Personal history of transient ischemic attack (TIA), and cerebral infarction without residual deficits: Secondary | ICD-10-CM | POA: Diagnosis not present

## 2021-12-11 DIAGNOSIS — A419 Sepsis, unspecified organism: Secondary | ICD-10-CM | POA: Diagnosis present

## 2021-12-11 DIAGNOSIS — N184 Chronic kidney disease, stage 4 (severe): Secondary | ICD-10-CM

## 2021-12-11 DIAGNOSIS — E872 Acidosis, unspecified: Secondary | ICD-10-CM | POA: Diagnosis present

## 2021-12-11 DIAGNOSIS — S0083XS Contusion of other part of head, sequela: Secondary | ICD-10-CM | POA: Diagnosis not present

## 2021-12-11 DIAGNOSIS — Z79899 Other long term (current) drug therapy: Secondary | ICD-10-CM | POA: Diagnosis not present

## 2021-12-11 LAB — CBC
HCT: 27.9 % — ABNORMAL LOW (ref 36.0–46.0)
Hemoglobin: 8.8 g/dL — ABNORMAL LOW (ref 12.0–15.0)
MCH: 33.7 pg (ref 26.0–34.0)
MCHC: 31.5 g/dL (ref 30.0–36.0)
MCV: 106.9 fL — ABNORMAL HIGH (ref 80.0–100.0)
Platelets: 154 10*3/uL (ref 150–400)
RBC: 2.61 MIL/uL — ABNORMAL LOW (ref 3.87–5.11)
RDW: 14.8 % (ref 11.5–15.5)
WBC: 28.8 10*3/uL — ABNORMAL HIGH (ref 4.0–10.5)
nRBC: 0 % (ref 0.0–0.2)

## 2021-12-11 LAB — BASIC METABOLIC PANEL
Anion gap: 5 (ref 5–15)
BUN: 30 mg/dL — ABNORMAL HIGH (ref 8–23)
CO2: 19 mmol/L — ABNORMAL LOW (ref 22–32)
Calcium: 7.2 mg/dL — ABNORMAL LOW (ref 8.9–10.3)
Chloride: 119 mmol/L — ABNORMAL HIGH (ref 98–111)
Creatinine, Ser: 1.88 mg/dL — ABNORMAL HIGH (ref 0.44–1.00)
GFR, Estimated: 25 mL/min — ABNORMAL LOW (ref >60)
Glucose, Bld: 77 mg/dL (ref 70–99)
Potassium: 4 mmol/L (ref 3.5–5.1)
Sodium: 143 mmol/L (ref 135–145)

## 2021-12-11 LAB — PROCALCITONIN: Procalcitonin: 9.79 ng/mL

## 2021-12-11 MED ORDER — SODIUM BICARBONATE 650 MG PO TABS
650.0000 mg | ORAL_TABLET | Freq: Three times a day (TID) | ORAL | Status: DC
  Administered 2021-12-11 – 2021-12-12 (×5): 650 mg via ORAL
  Filled 2021-12-11 (×5): qty 1

## 2021-12-11 NOTE — Progress Notes (Signed)
  Progress Note   Patient: Carolyn Kline LTY:[id number] DOB: 09/22/1928 DOA: 12/10/2021     0 DOS: the patient was seen and examined on 12/11/2021   Brief hospital course: Ms. Rami Waddle is a 88 year old female with history of esophageal dysmotility found to have esophageal stenosis status postdilatation, hypertension, history of stroke, left lower lobe nodule, rheumatoid arthritis on Plaquenil and previously on Humira, current tobacco use of half a pack per day, CKD stage IIIb, who presents to the emergency department for chief concerns of falling on 12/07/2021. Patient was hypotensive, received 1 L of IV fluid bolus. Patient had a severe leukocytosis with white cell at 42, significant elevated procalcitonin level.  Chest x-ray showed right upper lobe pneumonia.  Patient was started on Rocephin and Zithromax.   Assessment and Plan: Severe  sepsis secondary to pneumonia. Right upper lobe pneumonia. Patient clearly has right upper lobe pneumonia on x-ray, significant elevation of procalcitonin level.  She does not have significant respiratory symptoms probably from old age. Patient also met sepsis criteria, but with associated hypotension, patient meet severe sepsis criteria. She received IV fluid bolus, currently she is hemodynamically stable. Continue Rocephin and Zithromax, pending culture results. Obtain speech therapy evaluation to rule out aspiration.  Chronic kidney disease stage IV. Mild metabolic acidosis. Acute kidney injury ruled out. Reviewed patient prior lab, patient GFR was less than 30 chronically, she does not have acute kidney injury. Renal function still stable, also start sodium bicarbonate for mild metabolic acidosis.  Fall at home. Obtain PT/OT.  Essential hypertension.  History of stroke. Continue home medicines.     Subjective:  Patient feels well today, denies any past, has some cough, nonproductive. No fever or chills.  Physical Exam: Vitals:    12/10/21 2009 12/10/21 2314 12/11/21 0417 12/11/21 0825  BP: 107/61 (!) 116/91 115/72 106/64  Pulse: (!) 59 75 75 70  Resp: 18 (!) $Remo'22 18 14  'gNNKU$ Temp: 97.6 F (36.4 C) 97.8 F (36.6 C) 99.1 F (37.3 C) 98.8 F (37.1 C)  TempSrc: Oral  Oral Oral  SpO2: 99% 100% 97% 95%  Weight:      Height:       General exam: Appears calm and comfortable  Respiratory system: Clear to auscultation. Respiratory effort normal. Cardiovascular system: S1 & S2 heard, RRR. No JVD, murmurs, rubs, gallops or clicks. No pedal edema. Gastrointestinal system: Abdomen is nondistended, soft and nontender. No organomegaly or masses felt. Normal bowel sounds heard. Central nervous system: Alert and oriented x2. No focal neurological deficits. Extremities: Symmetric 5 x 5 power. Skin: No rashes, lesions or ulcers   Data Reviewed:  Reviewed chest x-ray results and image, reviewed lab results.  Family Communication: Daughter updated  Disposition: Status is: Inpatient Remains inpatient appropriate because: Severity of disease, IV treatment.  Planned Discharge Destination: Home with Home Health    Time spent: 35 minutes  Author: Sharen Hones, MD 12/11/2021 10:43 AM  For on call review [email or web address].

## 2021-12-11 NOTE — Evaluation (Signed)
Physical Therapy Evaluation Patient Details Name: Carolyn Kline MRN: [id number] DOB: 1929-03-18 Today's Date: 12/11/2021  History of Present Illness  Pt is a 88 y/o F admitted on 12/10/21 after presenting from [facility] with c/o fall & striking her head on 12/07/21. CT of head & cervical spine was negative for acute traumatic injury. Pt noted to have severe sepsis 2/2 RUL PNA. PMH: esophageal dysmotility, esphogeal stenosis s/p postdilation, HTN, stroke, L lower lobe nodule, RA, current tobacco use, CKD 3B  Clinical Impression  Pt seen for PT evaluation with pt received asleep in bed but awakened & agreeable to tx. Pt reports she lives in a 1 level home with 4 steps with R rail to enter & she is ambulatory without AD. Pt reports her grandson lives with her but works. Pt reports she doesn't cook or clean & has 5 children that take care of that. On this date, pt is mod I for supine<>sit & supervision for STS from EOB. Pt ambulates 1 lap around nurses station without AD with CGA with gait pattern as noted below. Back in room, pt elects to return to bed & declines trying RW for safe ambulation, as well as declines sitting in recliner despite max encouragement/education. Anticipate pt can d/c home with HHPT & supervision/assist from family. Will continue to follow pt acutely to address balance, gait & stairs with LRAD, and endurance.        Recommendations for follow up therapy are one component of a multi-disciplinary discharge planning process, led by the attending physician.  Recommendations may be updated based on patient status, additional functional criteria and insurance authorization.  Follow Up Recommendations Home health PT      Assistance Recommended at Discharge Intermittent Supervision/Assistance  Patient can return home with the following  A little help with walking and/or transfers;A little help with bathing/dressing/bathroom;Assistance with cooking/housework;Assist for  transportation;Help with stairs or ramp for entrance    Equipment Recommendations Rolling walker (2 wheels)  Recommendations for Other Services       Functional Status Assessment Patient has had a recent decline in their functional status and demonstrates the ability to make significant improvements in function in a reasonable and predictable amount of time.     Precautions / Restrictions Precautions Precautions: Fall Restrictions Weight Bearing Restrictions: No      Mobility  Bed Mobility Overal bed mobility: Modified Independent             General bed mobility comments: supine<>sit with bed flat    Transfers Overall transfer level: Needs assistance Equipment used: None Transfers: Sit to/from Stand Sit to Stand: Supervision                Ambulation/Gait Ambulation/Gait assistance: Min guard Gait Distance (Feet): 175 Feet Assistive device: None Gait Pattern/deviations: Staggering left, Decreased step length - right, Decreased step length - left, Decreased stride length Gait velocity: decreased     General Gait Details: increased lateral sway to L, intermittently reaching for objects for light support (furniture in room, rail in hallway)  Stairs            Wheelchair Mobility    Modified Rankin (Stroke Patients Only)       Balance Overall balance assessment: Needs assistance Sitting-balance support: Feet supported Sitting balance-Leahy Scale: Good     Standing balance support: No upper extremity supported Standing balance-Leahy Scale: Fair  Pertinent Vitals/Pain Pain Assessment Pain Assessment: No/denies pain    Home Living Family/patient expects to be discharged to:: Private residence Living Arrangements:  (grandson) Available Help at Discharge: Family;Available PRN/intermittently Type of Home: House Home Access: Stairs to enter Entrance Stairs-Rails: Right Entrance Stairs-Number of  Steps: 4-5   Home Layout: One level Home Equipment: Rollator (4 wheels)      Prior Function Prior Level of Function : Independent/Modified Independent             Mobility Comments: Pt reports she's ambulatory without AD, only 1 fall in past 6 months ADLs Comments: pt reports she doesn't cook & clean, she has 5 children that assist     Hand Dominance        Extremity/Trunk Assessment   Upper Extremity Assessment Upper Extremity Assessment: Overall WFL for tasks assessed    Lower Extremity Assessment Lower Extremity Assessment: Generalized weakness       Communication   Communication: HOH  Cognition Arousal/Alertness: Awake/alert Behavior During Therapy: WFL for tasks assessed/performed Overall Cognitive Status: Within Functional Limits for tasks assessed                                          General Comments General comments (skin integrity, edema, etc.): HR 86-102 bpm during session    Exercises     Assessment/Plan    PT Assessment Patient needs continued PT services  PT Problem List Decreased strength;Cardiopulmonary status limiting activity;Decreased activity tolerance;Decreased balance;Decreased mobility;Decreased knowledge of use of DME;Decreased safety awareness       PT Treatment Interventions DME instruction;Therapeutic exercise;Gait training;Stair training;Neuromuscular re-education;Functional mobility training;Therapeutic activities;Patient/family education;Balance training    PT Goals (Current goals can be found in the Care Plan section)  Acute Rehab PT Goals Patient Stated Goal: get better PT Goal Formulation: With patient Time For Goal Achievement: 12/25/21    Frequency Min 2X/week     Co-evaluation               AM-PAC PT "6 Clicks" Mobility  Outcome Measure Help needed turning from your back to your side while in a flat bed without using bedrails?: None Help needed moving from lying on your back to sitting  on the side of a flat bed without using bedrails?: None Help needed moving to and from a bed to a chair (including a wheelchair)?: A Little Help needed standing up from a chair using your arms (e.g., wheelchair or bedside chair)?: A Little Help needed to walk in hospital room?: A Little Help needed climbing 3-5 steps with a railing? : A Little 6 Click Score: 20    End of Session   Activity Tolerance: Patient tolerated treatment well;Patient limited by fatigue Patient left: in bed;with call bell/phone within reach;with bed alarm set Nurse Communication: Mobility status PT Visit Diagnosis: Muscle weakness (generalized) (M62.81);Unsteadiness on feet (R26.81)    Time: 0092-3300 PT Time Calculation (min) (ACUTE ONLY): 12 min   Charges:   PT Evaluation $PT Eval Low Complexity: Hard[place], PT, DPT 12/11/21, 2:49 PM   Waunita Schooner 12/11/2021, 2:47 PM

## 2021-12-12 ENCOUNTER — Inpatient Hospital Stay: Payer: Medicare HMO

## 2021-12-12 DIAGNOSIS — I10 Essential (primary) hypertension: Secondary | ICD-10-CM

## 2021-12-12 DIAGNOSIS — N179 Acute kidney failure, unspecified: Secondary | ICD-10-CM

## 2021-12-12 DIAGNOSIS — M069 Rheumatoid arthritis, unspecified: Secondary | ICD-10-CM

## 2021-12-12 DIAGNOSIS — J189 Pneumonia, unspecified organism: Secondary | ICD-10-CM | POA: Diagnosis not present

## 2021-12-12 DIAGNOSIS — S0083XS Contusion of other part of head, sequela: Secondary | ICD-10-CM

## 2021-12-12 DIAGNOSIS — A419 Sepsis, unspecified organism: Secondary | ICD-10-CM | POA: Diagnosis not present

## 2021-12-12 DIAGNOSIS — N189 Chronic kidney disease, unspecified: Secondary | ICD-10-CM

## 2021-12-12 LAB — BASIC METABOLIC PANEL
Anion gap: 7 (ref 5–15)
BUN: 27 mg/dL — ABNORMAL HIGH (ref 8–23)
CO2: 17 mmol/L — ABNORMAL LOW (ref 22–32)
Calcium: 7.7 mg/dL — ABNORMAL LOW (ref 8.9–10.3)
Chloride: 120 mmol/L — ABNORMAL HIGH (ref 98–111)
Creatinine, Ser: 1.82 mg/dL — ABNORMAL HIGH (ref 0.44–1.00)
GFR, Estimated: 26 mL/min — ABNORMAL LOW (ref >60)
Glucose, Bld: 75 mg/dL (ref 70–99)
Potassium: 3.7 mmol/L (ref 3.5–5.1)
Sodium: 144 mmol/L (ref 135–145)

## 2021-12-12 LAB — URINALYSIS, ROUTINE W REFLEX MICROSCOPIC
Bilirubin Urine: NEGATIVE
Glucose, UA: NEGATIVE mg/dL
Ketones, ur: NEGATIVE mg/dL
Nitrite: NEGATIVE
Protein, ur: NEGATIVE mg/dL
Specific Gravity, Urine: 1.011 (ref 1.005–1.030)
pH: 5 (ref 5.0–8.0)

## 2021-12-12 LAB — CBC
HCT: 27.5 % — ABNORMAL LOW (ref 36.0–46.0)
Hemoglobin: 9 g/dL — ABNORMAL LOW (ref 12.0–15.0)
MCH: 34.2 pg — ABNORMAL HIGH (ref 26.0–34.0)
MCHC: 32.7 g/dL (ref 30.0–36.0)
MCV: 104.6 fL — ABNORMAL HIGH (ref 80.0–100.0)
Platelets: 173 10*3/uL (ref 150–400)
RBC: 2.63 MIL/uL — ABNORMAL LOW (ref 3.87–5.11)
RDW: 14.8 % (ref 11.5–15.5)
WBC: 22.8 10*3/uL — ABNORMAL HIGH (ref 4.0–10.5)
nRBC: 0 % (ref 0.0–0.2)

## 2021-12-12 LAB — MAGNESIUM: Magnesium: 1.9 mg/dL (ref 1.7–2.4)

## 2021-12-12 LAB — PROCALCITONIN: Procalcitonin: 8.14 ng/mL

## 2021-12-12 MED ORDER — SODIUM BICARBONATE 650 MG PO TABS: 650.0000 mg | ORAL_TABLET | Freq: Two times a day (BID) | ORAL | 0 refills | Status: DC

## 2021-12-12 MED ORDER — CEFDINIR 300 MG PO CAPS: 300.0000 mg | ORAL_CAPSULE | Freq: Two times a day (BID) | ORAL | 0 refills | Status: AC

## 2021-12-12 MED ORDER — AZITHROMYCIN 250 MG PO TABS: ORAL_TABLET | ORAL | 0 refills | Status: DC

## 2021-12-12 NOTE — Progress Notes (Signed)
Physical Therapy Treatment Patient Details Name: Carolyn Kline MRN: [id number] DOB: [birth year] Today's Date: 12/12/2021   History of Present Illness Pt is a 88 y/o F admitted on 12/10/21 after presenting from [facility] with c/o fall & striking her head on 12/07/21. CT of head & cervical spine was negative for acute traumatic injury. Pt noted to have severe sepsis 2/2 RUL PNA. PMH: esophageal dysmotility, esphogeal stenosis s/p postdilation, HTN, stroke, L lower lobe nodule, RA, current tobacco use, CKD 3B    PT Comments    Pt was pleasant and motivated to participate during the session and put forth good effort throughout. Pt is able to complete supine to sit w/ modI. Pt completes sit to stands with supervision with good stability. Pt with steady gait  while using rollator with no LOB or sway deviations. Minimal sway noted when pt did walk short distance using no AD to stairs but otherwise fairly stable. Pt is able to complete stairs using R rail with good stability and min cuing for sequencing. Pt will benefit from HHPT upon discharge to safely address deficits listed in patient problem list for decreased caregiver assistance and eventual return to PLOF.    Recommendations for follow up therapy are one component of a multi-disciplinary discharge planning process, led by the attending physician.  Recommendations may be updated based on patient status, additional functional criteria and insurance authorization.  Follow Up Recommendations  Home health PT     Assistance Recommended at Discharge Intermittent Supervision/Assistance  Patient can return home with the following A little help with walking and/or transfers;A little help with bathing/dressing/bathroom;Assistance with cooking/housework;Assist for transportation;Help with stairs or ramp for entrance   Equipment Recommendations  Rolling walker (2 wheels)    Recommendations for Other Services       Precautions / Restrictions  Precautions Precautions: Fall Restrictions Weight Bearing Restrictions: No     Mobility  Bed Mobility Overal bed mobility: Modified Independent                  Transfers Overall transfer level: Needs assistance Equipment used: Rollator (4 wheels) Transfers: Sit to/from Stand Sit to Stand: Supervision                Ambulation/Gait Ambulation/Gait assistance: Supervision Gait Distance (Feet): 200 Feet Assistive device: Rollator (4 wheels) Gait Pattern/deviations: Decreased step length - right, Decreased step length - left, Decreased stride length, Step-through pattern Gait velocity: decreased     General Gait Details: steadying gait using rollator with no LOB or lateral sway noted.   Stairs Stairs: Yes Stairs assistance: Min guard Stair Management: One rail Right, Step to pattern, Forwards Number of Stairs: 4 General stair comments: stable throughout, min cuing for sequencing   Wheelchair Mobility    Modified Rankin (Stroke Patients Only)       Balance Overall balance assessment: Needs assistance Sitting-balance support: Feet supported Sitting balance-Leahy Scale: Good     Standing balance support: Bilateral upper extremity supported, During functional activity Standing balance-Leahy Scale: Fair                              Cognition Arousal/Alertness: Awake/alert Behavior During Therapy: WFL for tasks assessed/performed, Flat affect Overall Cognitive Status: No family/caregiver present to determine baseline cognitive functioning  Exercises General Exercises - Lower Extremity Long Arc Quad: Strengthening, Both, 10 reps Heel Slides: Strengthening, Both, 10 reps Straight Leg Raises: Strengthening, Both, 10 reps Hip Flexion/Marching: Seated, Strengthening, Both, 10 reps Other Exercises Other Exercises: Pt education on stair sequencing    General Comments         Pertinent Vitals/Pain Pain Assessment Pain Assessment: No/denies pain    Home Living                          Prior Function            PT Goals (current goals can now be found in the care plan section) Progress towards PT goals: Progressing toward goals    Frequency    Min 2X/week      PT Plan Current plan remains appropriate    Co-evaluation              AM-PAC PT "6 Clicks" Mobility   Outcome Measure  Help needed turning from your back to your side while in a flat bed without using bedrails?: None Help needed moving from lying on your back to sitting on the side of a flat bed without using bedrails?: None Help needed moving to and from a bed to a chair (including a wheelchair)?: A Little Help needed standing up from a chair using your arms (e.g., wheelchair or bedside chair)?: None Help needed to walk in hospital room?: A Little Help needed climbing 3-5 steps with a railing? : A Little 6 Click Score: 21    End of Session Equipment Utilized During Treatment: Gait belt Activity Tolerance: Patient tolerated treatment well Patient left: in chair;with call bell/phone within reach;with chair alarm set;with family/visitor present Nurse Communication: Mobility status PT Visit Diagnosis: Muscle weakness (generalized) (M62.81);Unsteadiness on feet (R26.81)     Time: 0802-2336 PT Time Calculation (min) (ACUTE ONLY): 30 min  Charges:                        Turner Daniels, SPT  12/12/2021, 4:42 PM

## 2021-12-12 NOTE — Progress Notes (Signed)
Discharge instructions, RX's and follow up appts explaimed and provided to patient and daughter verbalized understanding. Home health services set up by case mang. No c/o pain or shortness of breath at d/c.  Sherolyn Trettin, Tivis Ringer, RN

## 2021-12-12 NOTE — Plan of Care (Signed)
  Problem: Fluid Volume: Goal: Hemodynamic stability will improve Outcome: Adequate for Discharge   Problem: Clinical Measurements: Goal: Diagnostic test results will improve Outcome: Adequate for Discharge Goal: Signs and symptoms of infection will decrease Outcome: Adequate for Discharge   Problem: Respiratory: Goal: Ability to maintain adequate ventilation will improve Outcome: Adequate for Discharge   Problem: Education: Goal: Knowledge of General Education information will improve Description: Including pain rating scale, medication(s)/side effects and non-pharmacologic comfort measures Outcome: Adequate for Discharge   Problem: Health Behavior/Discharge Planning: Goal: Ability to manage health-related needs will improve Outcome: Adequate for Discharge   Problem: Clinical Measurements: Goal: Ability to maintain clinical measurements within normal limits will improve Outcome: Adequate for Discharge Goal: Will remain free from infection Outcome: Adequate for Discharge Goal: Diagnostic test results will improve Outcome: Adequate for Discharge Goal: Respiratory complications will improve Outcome: Adequate for Discharge Goal: Cardiovascular complication will be avoided Outcome: Adequate for Discharge   Problem: Activity: Goal: Risk for activity intolerance will decrease Outcome: Adequate for Discharge   Problem: Nutrition: Goal: Adequate nutrition will be maintained Outcome: Adequate for Discharge   Problem: Coping: Goal: Level of anxiety will decrease Outcome: Adequate for Discharge   Problem: Elimination: Goal: Will not experience complications related to bowel motility Outcome: Adequate for Discharge Goal: Will not experience complications related to urinary retention Outcome: Adequate for Discharge   Problem: Pain Managment: Goal: General experience of comfort will improve Outcome: Adequate for Discharge   Problem: Safety: Goal: Ability to remain free  from injury will improve Outcome: Adequate for Discharge   Problem: Pain Managment: Goal: General experience of comfort will improve Outcome: Adequate for Discharge   Problem: Safety: Goal: Ability to remain free from injury will improve Outcome: Adequate for Discharge   Problem: Skin Integrity: Goal: Risk for impaired skin integrity will decrease Outcome: Adequate for Discharge   Problem: Acute Rehab PT Goals(only PT should resolve) Goal: Pt Will Ambulate Outcome: Adequate for Discharge Goal: Pt Will Go Up/Down Stairs Outcome: Adequate for Discharge   Problem: Acute Rehab PT Goals(only PT should resolve) Goal: Pt Will Ambulate Outcome: Adequate for Discharge Goal: Pt Will Go Up/Down Stairs Outcome: Adequate for Discharge   Problem: Acute Rehab OT Goals (only OT should resolve) Goal: Pt. Will Perform Grooming Outcome: Adequate for Discharge Goal: Pt. Will Perform Lower Body Dressing Outcome: Adequate for Discharge Goal: Pt. Will Transfer To Toilet Outcome: Adequate for Discharge Goal: Pt. Will Perform Toileting-Clothing Manipulation Outcome: Adequate for Discharge

## 2021-12-12 NOTE — TOC Progression Note (Addendum)
Transition of Care [facility]) - Progression Note    Patient Details  Name: Carolyn Kline MRN: [id number] Date of Birth: 08-17-1928  Transition of [facility]) CM/SW Contact  Laurena Slimmer, RN Phone Number: 12/12/2021, 2:34 PM  Clinical Narrative:    Spoke with patient's daughter Carolyn Kline. Belinda request to speak with patient's daughter Carolyn Kline.   Spoke with Carolyn Kline, patient's daughter. Patient admitted for sepsis. Patient lives at home and is supported by her daughter's, Carolyn Kline and Carolyn Kline. Family will transport her home at discharge. Patient obtains medication from [facility]. Denieds issues obtaining mediations. Discussed HH recommendations. Family did not have preference of [place] agencies. Referral sent and accepted by Tommi Rumps at [facility]: walker. Shower chair, lift chair         Expected Discharge Plan and Services                                                 Social Determinants of Health (SDOH) Interventions    Readmission Risk Interventions     No data to display

## 2021-12-12 NOTE — Progress Notes (Signed)
Modified Barium Swallow Progress Note  Patient Details  Name: Carolyn Kline MRN: [id number] Date of Birth: 18-Oct-1928  Today's Date: 12/12/2021  Modified Barium Swallow completed.  Full report located under Chart Review in the Imaging Section.  Brief recommendations include the following:  Clinical Impression  Pt has severe history of esophageal dysfunction with esophageal dilation 12/2020. As such, she presents with oral and pharyngeal phase characteristic of pt's with chronic esophageal dysfunction (prolonged oral phase, piecemeal swallowing). However, despite this pt's overall oropharyngeal function is adequate when consuming thin liquids via cup, nectar thick liquids via cup, puree and graham cracker with puree. As such, her risk of aspiration is reduced when following general aspiration precautions. Esophageal sweep didn't reveal any backflow of bolus into the thoracic or cervical esophagus. At this time, recommend continuing currently prescribed diet. No ST intervention is indicated.   Swallow Evaluation Recommendations       SLP Diet Recommendations: Regular solids;Thin liquid   Liquid Administration via: Cup   Medication Administration: Whole meds with liquid   Supervision: Patient able to self feed   Compensations: Minimize environmental distractions;Slow rate;Small sips/bites   Postural Changes: Seated upright at 90 degrees;Remain semi-upright after after feeds/meals (Comment)   Oral Care Recommendations: Oral care BID       Madlynn Lundeen B. Rutherford Nail, M.S., CCC-SLP, Mining engineer Certified Brain Injury [place]  [place] Office [phone] Ascom [phone] Fax [phone]

## 2021-12-12 NOTE — Evaluation (Signed)
Occupational Therapy Evaluation Patient Details Name: Carolyn Kline MRN: [id number] DOB: Oct 19, 1928 Today's Date: 12/12/2021   History of Present Illness Pt is a 88 y/o F admitted on 12/10/21 after presenting from [facility] with c/o fall & striking her head on 12/07/21. CT of head & cervical spine was negative for acute traumatic injury. Pt noted to have severe sepsis 2/2 RUL PNA. PMH: esophageal dysmotility, esphogeal stenosis s/p postdilation, HTN, stroke, L lower lobe nodule, RA, current tobacco use, CKD 3B   Clinical Impression   Chart reviewed, pt greeted in room agreeable to OT evaluation. Pt is alert and oriented to self, place, grossly to situation. Pt is not oriented to date. Pt reports she lives with her grandson and has children that assist with ADL/IADL as needed.  Pt presents with deficits in strength, endurance, activity tolerance, and cognition on this date affecting safe and optimal ADL completion. Intermittent vcs required for use of rollator during ADL tasks, please see below (pt reports this is her personal rollator). At this time pt would benefit from [facility] to address deficits and for family to assist with med management. Pt is left as received, NAD, all needs met. OT will follow acutely.      Recommendations for follow up therapy are one component of a multi-disciplinary discharge planning process, led by the attending physician.  Recommendations may be updated based on patient status, additional functional criteria and insurance authorization.   Follow Up Recommendations  Home health OT    Assistance Recommended at Discharge Frequent or constant Supervision/Assistance  Patient can return home with the following A little help with bathing/dressing/bathroom;Assistance with cooking/housework    Functional Status Assessment  Patient has had a recent decline in their functional status and demonstrates the ability to make significant improvements in function in a  reasonable and predictable amount of time.  Equipment Recommendations  None recommended by OT    Recommendations for Other Services       Precautions / Restrictions Precautions Precautions: Fall Restrictions Weight Bearing Restrictions: No      Mobility Bed Mobility Overal bed mobility: Modified Independent                  Transfers Overall transfer level: Needs assistance Equipment used: Rollator (4 wheels) Transfers: Sit to/from Stand Sit to Stand: Supervision                  Balance Overall balance assessment: Needs assistance Sitting-balance support: Feet supported Sitting balance-Leahy Scale: Good     Standing balance support: No upper extremity supported Standing balance-Leahy Scale: Fair                             ADL either performed or assessed with clinical judgement   ADL Overall ADL's : Needs assistance/impaired Eating/Feeding: Set up;Sitting   Grooming: Wash/dry hands;Standing;Wash/dry face;Sitting;Supervision/safety           Upper Body Dressing : Set up;Sitting   Lower Body Dressing: Supervision/safety;Sit to/from stand Lower Body Dressing Details (indicate cue type and reason): briefs Toilet Transfer: Supervision/safety;Ambulation;Rollator (4 wheels)   Toileting- Clothing Manipulation and Hygiene: Minimal assistance Toileting - Clothing Manipulation Details (indicate cue type and reason): for thoroughness     Functional mobility during ADLs: Supervision/safety;Rollator (4 wheels) (household distances)       Vision Patient Visual Report: No change from baseline       Perception     Praxis  Pertinent Vitals/Pain Pain Assessment Pain Assessment: No/denies pain     Hand Dominance     Extremity/Trunk Assessment Upper Extremity Assessment Upper Extremity Assessment: Generalized weakness   Lower Extremity Assessment Lower Extremity Assessment: Generalized weakness       Communication      Cognition Arousal/Alertness: Awake/alert Behavior During Therapy: WFL for tasks assessed/performed, Flat affect Overall Cognitive Status: No family/caregiver present to determine baseline cognitive functioning Area of Impairment: Orientation, Memory, Awareness, Problem solving                 Orientation Level: Disoriented to, Time   Memory: Decreased short-term memory     Awareness: Emergent Problem Solving: Slow processing, Requires verbal cues, Requires tactile cues       General Comments  pt with noted blood and tenderness after wiping following BM, no breakdown visualized however RN notified and aware, HR below 100 BPM throughout    Exercises     Shoulder Instructions      Home Living Family/patient expects to be discharged to:: Private residence Living Arrangements: Other relatives (grandson) Available Help at Discharge: Family;Available PRN/intermittently (5 children, grandson; pt reports she is at home alone at times) Type of Home: House Home Access: Stairs to enter [employer] of Steps: 4-5 Entrance Stairs-Rails: Right       Bathroom Shower/Tub: Walk-in shower         Home Equipment: Rollator (4 wheels);Shower seat;Grab bars - tub/shower          Prior Functioning/Environment Prior Level of Function : Independent/Modified Independent             Mobility Comments: amb witout AD, PRN use of rollator per pt report ADLs Comments: pt reports assist for dressing when family is present however able to complete with increased time; family assists with IADLs (cooking, cleaning, driving, grocery shopping)        OT Problem List: Decreased strength;Decreased activity tolerance      OT Treatment/Interventions: Self-care/ADL training;Patient/family education;Therapeutic exercise;Balance training;Energy conservation;Therapeutic activities;DME and/or AE instruction;Cognitive remediation/compensation    OT Goals(Current goals can be found in  the care plan section) Acute Rehab OT Goals Patient Stated Goal: go home OT Goal Formulation: With patient Time For Goal Achievement: 12/26/21 Potential to Achieve Goals: Good ADL Goals Pt Will Perform Grooming: with modified independence;standing Pt Will Perform Lower Body Dressing: with modified independence Pt Will Transfer to Toilet: with modified independence;ambulating Pt Will Perform Toileting - Clothing Manipulation and hygiene: with modified independence  OT Frequency: Min 2X/week    Co-evaluation              AM-PAC OT "6 Clicks" Daily Activity     Outcome Measure Help from another person eating meals?: None Help from another person taking care of personal grooming?: None Help from another person toileting, which includes using toliet, bedpan, or urinal?: A Little Help from another person bathing (including washing, rinsing, drying)?: A Little Help from another person to put on and taking off regular upper body clothing?: A Little Help from another person to put on and taking off regular lower body clothing?: A Little 6 Click Score: 20   End of Session Equipment Utilized During Treatment: Rollator (4 wheels) Nurse Communication: Mobility status  Activity Tolerance: Patient tolerated treatment well Patient left: in chair;with call bell/phone within reach;with chair alarm set  OT Visit Diagnosis: Unsteadiness on feet (R26.81);History of falling (Z91.81)                Time: [phone]  OT Time Calculation (min): 20 min Charges:  OT General Charges $OT Visit: 1 Visit OT Evaluation $OT Eval Low Complexity: 1 Low  Shanon Payor, OTD OTR/L  12/12/21, 9:36 AM

## 2021-12-12 NOTE — Discharge Summary (Signed)
Physician Discharge Summary   Patient: Carolyn Kline MRN: [id number] DOB: 1928-08-29  Admit date:     12/10/2021  Discharge date: 12/12/21  Discharge Physician: Loletha Grayer   PCP: Leonel Ramsay, MD   Recommendations at discharge:   Follow-up PCP 5 days  Discharge Diagnoses: Principal Problem:   Severe sepsis ([place]) Active Problems:   History of stroke   Essential hypertension   Community acquired pneumonia   RA (rheumatoid arthritis) (HCC)   Traumatic ecchymosis of orbital rim, initial encounter   Traumatic ecchymosis of forehead   Fall at home, initial encounter   Pneumonia   Metabolic acidosis   CKD (chronic kidney disease) stage 4, GFR 15-29 ml/min [facility])    Hospital Course: Carolyn Kline is a 88 year old female with history of esophageal dysmotility found to have esophageal stenosis status postdilatation, hypertension, history of stroke, left lower lobe nodule, rheumatoid arthritis on Plaquenil and previously on Humira, current tobacco use of half a pack per day, CKD stage IIIb, who presents to the emergency department for chief concerns of falling on 12/07/2021. Patient was hypotensive, received 1 L of IV fluid bolus. Patient had a severe leukocytosis with white cell at 42, significant elevated procalcitonin level.  Chest x-ray showed right upper lobe pneumonia.  Patient was started on Rocephin and Zithromax.  The patient received 3 days of Rocephin and Zithromax here in the hospital and was feeling better.  Will be discharged home on 2 more days of Omnicef and Zithromax upon discharge.   Assessment and Plan: * Severe sepsis ([place]) - Severe sepsis with initial hypotension responsive to IV fluid bolus with renal involvement - First Lactic acid elevated at 2.5 with elevated procalcitonin at 7.44 - Patient presented with hypotension heart rate of 82/47 (MAP of 59) responded to IV fluid bolus, markedly acute leukocytosis of 42.  White blood cell count came down to  22.8.  White count likely elevated also with the trauma. -Patient received 3 days of IV Rocephin and Zithromax.  Patient was feeling better.  We will switch over to oral Omnicef and Zithromax for 2 more days upon discharge.  Recommend checking a white blood cell count as outpatient.   Fall at home, subsequent encounter - Presumed secondary to pneumonia and meeting sepsis criteria --The patient had a CT scan that was negative. --The patient has bruising around both eyes. --Physical therapy recommended home health  Traumatic ecchymosis of forehead - Present on admission secondary to mechanical fall at home  Traumatic ecchymosis of orbital rim, subsequent encounter - Bilateral right greater than left - Present on admission; secondary to mechanical fall at home on 12/08/2021  AKI (acute kidney injury) ([place]) on chronic kidney disease stage IV - On CKD stage IV - Patient received IV fluids.  Creatinine came down from 2.51 down to 1.82  RA (rheumatoid arthritis) ([place]) - Patient is currently on Humira  Community acquired pneumonia -Patient received 3 days of IV Rocephin and Zithromax here in the hospital and switch over to [facility] and Zithromax upon discharge home  Essential hypertension - No longer takes antihypertensive medications.  History of stroke - Resumed home Aggrenox 200-25 mg p.o. every 12 hours for secondary stroke prevention - Patient is no longer on statin medication         Consultants: None Procedures performed: None Disposition: Home Diet recommendation:  Regular diet DISCHARGE MEDICATION: Allergies as of 12/12/2021   No Known Allergies      Medication List     STOP  taking these medications    ascorbic acid 500 MG tablet Commonly known as: VITAMIN C   calcium carbonate 1500 (600 Ca) MG Tabs tablet Commonly known as: OSCAL   chlorpheniramine-HYDROcodone 10-8 MG/5ML Suer Commonly known as: TUSSIONEX   simvastatin 40 MG tablet Commonly known as:  ZOCOR   vitamin B-12 1000 MCG tablet Commonly known as: CYANOCOBALAMIN       TAKE these medications    alendronate 70 MG tablet Commonly known as: FOSAMAX Take 70 mg by mouth once a week.   azithromycin 250 MG tablet Commonly known as: Zithromax One tab po daily for one two more days Start taking on: December 13, 2021   cefdinir 300 MG capsule Commonly known as: OMNICEF Take 1 capsule (300 mg total) by mouth 2 (two) times daily for 2 days. Start taking on: December 13, 2021   dipyridamole-aspirin 200-25 MG 12hr capsule Commonly known as: AGGRENOX Take 1 capsule by mouth 2 (two) times daily.   guaiFENesin-dextromethorphan 100-10 MG/5ML syrup Commonly known as: ROBITUSSIN DM Take 10 mLs by mouth every 4 (four) hours as needed for cough.   mirtazapine 30 MG tablet Commonly known as: REMERON Take 30 mg by mouth at bedtime.   pantoprazole 40 MG tablet Commonly known as: PROTONIX Take 40 mg by mouth 2 (two) times daily.   sodium bicarbonate 650 MG tablet Take 1 tablet (650 mg total) by mouth 2 (two) times daily.   zinc sulfate 220 (50 Zn) MG capsule Take 1 capsule (220 mg total) by mouth daily.               Durable Medical Equipment  (From admission, onward)           Start     Ordered   12/11/21 2158  For home use only DME Walker rolling  Once       Question Answer Comment  Walker: With 5 Inch Wheels   Patient needs a walker to treat with the following condition Generalized weakness      12/11/21 2157            Follow-up Information     Leonel Ramsay, MD. Go in 6 day(s).   Specialty: Infectious Diseases Why: Appointment on Tuesday 12/18/2021 at [place] information: [place] [phone]                Discharge Exam: Danley Danker Weights   12/10/21 1057 12/10/21 1843  Weight: 42.6 kg 44 kg   Physical Exam HENT:     Head: Normocephalic.     Mouth/Throat:     Pharynx: No oropharyngeal exudate.   Eyes:     General: Lids are normal.     Conjunctiva/sclera: Conjunctivae normal.  Cardiovascular:     Rate and Rhythm: Normal rate and regular rhythm.     Heart sounds: Normal heart sounds, S1 normal and S2 normal.  Pulmonary:     Breath sounds: No decreased breath sounds, wheezing, rhonchi or rales.  Abdominal:     Palpations: Abdomen is soft.     Tenderness: There is no abdominal tenderness.  Musculoskeletal:     Right lower leg: No swelling.     Left lower leg: No swelling.  Skin:    General: Skin is warm.     Findings: No rash.     Comments: Bruising around both eyes.  Neurological:     Mental Status: She is alert and oriented to person, place, and time.  Condition at discharge: stable  The results of significant diagnostics from this hospitalization (including imaging, microbiology, ancillary and laboratory) are listed below for reference.   Imaging Studies: DG Swallowing Func-Speech Pathology  Result Date: 12/12/2021 Table formatting from the original result was not included. Images from the original result were not included. Objective Swallowing Evaluation: Type of Study: MBS-Modified Barium Swallow Study  Patient Details Name: Carolyn Kline MRN: [id number] Date of Birth: [birth year] Today's Date: 12/12/2021 Time: SLP Start Time (ACUTE ONLY): 1107 -SLP Stop Time (ACUTE ONLY): 1130 SLP Time Calculation (min) (ACUTE ONLY): 23 min Past Medical History: Past Medical History: Diagnosis Date  CVA (cerebral infarction)   Hyperlipemia   Hypertension   Stroke [facility])  Past Surgical History: Past Surgical History: Procedure Laterality Date  ESOPHAGOGASTRODUODENOSCOPY N/A 01/15/2021  Procedure: ESOPHAGOGASTRODUODENOSCOPY (EGD);  Surgeon: [name], DO;  Location: [facility] ENDOSCOPY;  Service: Gastroenterology;  Laterality: N/A;  EYE SURGERY Bilateral 01/2020  Eyelid Surgery HPI: Pt is a 88 y/o F admitted on 12/10/21 after presenting from [facility] with c/o fall & striking her  head on 12/07/21. CT of head & cervical spine was negative for acute traumatic injury. Pt noted to have severe sepsis 2/2 RUL PNA. PMH: esophageal dysmotility, esphogeal stenosis s/p postdilation, HTN, stroke, L lower lobe nodule, RA, current tobacco use, CKD 3B  Subjective: pt pleasant, conversant with this therapist  Recommendations for follow up therapy are one component of a multi-disciplinary discharge planning process, led by the attending physician.  Recommendations may be updated based on patient status, additional functional criteria and insurance authorization. Assessment / Plan / Recommendation   12/12/2021  11:00 AM Clinical Impressions Clinical Impression Pt has severe history of esophageal dysfunction with esophageal dilation 12/2020. As such, she presents with oral and pharyngeal phase characteristic of pt's with chronic esophageal dysfunction (prolonged oral phase, piecemeal swallowing). However, despite this pt's overall oropharyngeal function is adequate when consuming thin liquids via cup, nectar thick liquids via cup, puree and graham cracker with puree. As such, her risk of aspiration is reduced when following general aspiration precautions. Esophageal sweep didn't reveal any backflow of bolus into the thoracic or cervical esophagus. At this time, recommend continuing currently prescribed diet. No ST intervention is indicated. SLP Visit Diagnosis Dysphagia, oropharyngeal phase (R13.12) Impact on safety and function No limitations     12/12/2021  11:00 AM Treatment Recommendations Treatment Recommendations No treatment recommended at this time      No data to display      12/12/2021  11:00 AM Diet Recommendations SLP Diet Recommendations Regular solids;Thin liquid Liquid Administration via Cup Medication Administration Whole meds with liquid Compensations Minimize environmental distractions;Slow rate;Small sips/bites Postural Changes Seated upright at 90 degrees;Remain semi-upright after after  feeds/meals (Comment)     12/12/2021  11:00 AM Other Recommendations Oral Care Recommendations Oral care BID Follow Up Recommendations No SLP follow up Assistance recommended at discharge None Functional Status Assessment Patient has not had a recent decline in their functional status    No data to display         No data to display       12/12/2021  11:00 AM Pharyngeal Phase Pharyngeal Phase [facility]    12/12/2021  11:00 AM Cervical Esophageal Phase  Cervical Esophageal Phase WFL Happi B. Rutherford Nail, M.S., CCC-SLP, Mining engineer Certified Brain Injury [place]  [place] Office [phone] [place] [phone] Fax [phone]  DG Chest 2 View  Result Date: 12/10/2021 CLINICAL DATA:  Weakness, mechanical fall EXAM: CHEST - 2 VIEW COMPARISON:  CT chest, 03/06/2021 FINDINGS: The heart size and mediastinal contours are within normal limits. Heterogeneous airspace opacity of the peripheral right upper lobe superimposed upon emphysema. The visualized skeletal structures are unremarkable. IMPRESSION: 1. Heterogeneous airspace opacity of the peripheral right upper lobe, superimposed upon emphysema, concerning for infection. Recommend follow-up radiographs in 6-8 weeks to ensure complete radiographic resolution and exclude underlying mass. 2. Emphysema. Electronically Signed   By: Delanna Ahmadi M.D.   On: 12/10/2021 13:05   CT Cervical Spine Wo Contrast  Result Date: 12/10/2021 CLINICAL DATA:  Trauma, fall EXAM: CT CERVICAL SPINE WITHOUT CONTRAST TECHNIQUE: Multidetector CT imaging of the cervical spine was performed without intravenous contrast. Multiplanar CT image reconstructions were also generated. RADIATION DOSE REDUCTION: This exam was performed according to the departmental dose-optimization program which includes automated exposure control, adjustment of the mA and/or kV according to patient size and/or use of iterative  reconstruction technique. COMPARISON:  None Available. FINDINGS: Alignment: Alignment of the posterior margins of vertebral bodies is unremarkable. There is reversal of lordosis. Skull base and vertebrae: No recent fracture is seen. Degenerative changes are noted. Soft tissues and spinal canal: There is extrinsic pressure over the ventral margin of thecal sac caused by posterior bony spurs with mild-to-moderate spinal stenosis at C3-C4 level. There is mild spinal stenosis at C4-C7 levels. Disc levels: There is encroachment of neural foramina from C2 to T2 levels. Upper chest: There is pleural thickening in both apices with pleural calcifications suggesting scarring. Other: Thyroid is enlarged with inhomogeneous attenuation. IMPRESSION: No recent fracture is seen. Osteopenia. Cervical spondylosis with spinal stenosis and encroachment of neural foramina at multiple levels as described in the body of the report. Electronically Signed   By: Elmer Picker M.D.   On: 12/10/2021 12:02   CT HEAD WO CONTRAST  Result Date: 12/10/2021 CLINICAL DATA:  Trauma, fall EXAM: CT HEAD WITHOUT CONTRAST TECHNIQUE: Contiguous axial images were obtained from the base of the skull through the vertex without intravenous contrast. RADIATION DOSE REDUCTION: This exam was performed according to the departmental dose-optimization program which includes automated exposure control, adjustment of the mA and/or kV according to patient size and/or use of iterative reconstruction technique. COMPARISON:  05/28/2014 FINDINGS: Brain: No acute intracranial findings are seen. There are no signs of bleeding within the cranium. Cortical sulci are prominent. There is decreased density in periventricular and subcortical white matter. There is possible old infarct in the right frontal lobe. Vascular: Scattered arterial calcifications are seen. Skull: No fracture is seen in the calvarium. Sinuses/Orbits: There is mucosal thickening in ethmoid and left  maxillary sinuses. Effusions are seen in view of mastoid air cells on both sides. Other: None. IMPRESSION: No acute intracranial findings are seen in noncontrast CT brain. Atrophy. Small-vessel disease. Chronic sinusitis.  Bilateral mastoid effusions. Electronically Signed   By: Elmer Picker M.D.   On: 12/10/2021 11:54    Microbiology: Results for orders placed or performed during the hospital encounter of 12/10/21  Culture, blood (routine x 2)     Status: None (Preliminary result)   Collection Time: 12/10/21  1:10 PM   Specimen: BLOOD  Result Value Ref Range Status   Specimen Description BLOOD BLOOD RIGHT ARM  Final   Special Requests   Final    BOTTLES DRAWN AEROBIC AND ANAEROBIC Blood Culture adequate volume   Culture   Final  NO GROWTH 2 DAYS Performed at [facility], [address] [place], [place] [address]    Report Status PENDING  Incomplete  Culture, blood (routine x 2)     Status: None (Preliminary result)   Collection Time: 12/10/21  1:11 PM   Specimen: BLOOD  Result Value Ref Range Status   Specimen Description BLOOD BLOOD LEFT ARM  Final   Special Requests   Final    BOTTLES DRAWN AEROBIC AND ANAEROBIC Blood Culture adequate volume   Culture   Final    NO GROWTH 2 DAYS Performed at [facility], [place], [place] [address]    Report Status PENDING  Incomplete    Labs: CBC: Recent Labs  Lab 12/10/21 1109 12/11/21 0520 12/12/21 0459  WBC 40.9*  42.0* 28.8* 22.8*  NEUTROABS 36.6*  --   --   HGB 11.4*  11.3* 8.8* 9.0*  HCT 35.7*  35.4* 27.9* 27.5*  MCV 106.9*  107.6* 106.9* 104.6*  PLT 204  206 154 121   Basic Metabolic Panel: Recent Labs  Lab 12/10/21 1109 12/11/21 0520 12/12/21 0459  NA 137 143 144  K 4.8 4.0 3.7  CL 109 119* 120*  CO2 19* 19* 17*  GLUCOSE 155* 77 75  BUN 33* 30* 27*  CREATININE 2.51* 1.88* 1.82*  CALCIUM 8.5* 7.2* 7.7*  MG  --   --  1.9     Discharge time spent: greater than  30 minutes.  Signed: Loletha Grayer, MD [facility] 12/12/2021

## 2021-12-13 LAB — URINE CULTURE: Culture: <10000 — AB

## 2021-12-15 LAB — CULTURE, BLOOD (ROUTINE X 2)
Culture: NO GROWTH
Culture: NO GROWTH
Special Requests: ADEQUATE
Special Requests: ADEQUATE

## 2021-12-18 DIAGNOSIS — I951 Orthostatic hypotension: Secondary | ICD-10-CM | POA: Diagnosis not present

## 2021-12-18 DIAGNOSIS — F17210 Nicotine dependence, cigarettes, uncomplicated: Secondary | ICD-10-CM | POA: Diagnosis not present

## 2021-12-18 DIAGNOSIS — N189 Chronic kidney disease, unspecified: Secondary | ICD-10-CM | POA: Diagnosis not present

## 2021-12-18 DIAGNOSIS — J449 Chronic obstructive pulmonary disease, unspecified: Secondary | ICD-10-CM | POA: Diagnosis not present

## 2021-12-18 DIAGNOSIS — M069 Rheumatoid arthritis, unspecified: Secondary | ICD-10-CM | POA: Diagnosis not present

## 2021-12-18 DIAGNOSIS — I714 Abdominal aortic aneurysm, without rupture, unspecified: Secondary | ICD-10-CM | POA: Diagnosis not present

## 2021-12-18 DIAGNOSIS — I959 Hypotension, unspecified: Secondary | ICD-10-CM | POA: Diagnosis not present

## 2021-12-18 DIAGNOSIS — D72829 Elevated white blood cell count, unspecified: Secondary | ICD-10-CM | POA: Diagnosis not present

## 2021-12-31 DIAGNOSIS — J439 Emphysema, unspecified: Secondary | ICD-10-CM | POA: Diagnosis not present

## 2021-12-31 DIAGNOSIS — J189 Pneumonia, unspecified organism: Secondary | ICD-10-CM | POA: Diagnosis not present

## 2021-12-31 DIAGNOSIS — N179 Acute kidney failure, unspecified: Secondary | ICD-10-CM | POA: Diagnosis not present

## 2021-12-31 DIAGNOSIS — I129 Hypertensive chronic kidney disease with stage 1 through stage 4 chronic kidney disease, or unspecified chronic kidney disease: Secondary | ICD-10-CM | POA: Diagnosis not present

## 2021-12-31 DIAGNOSIS — A419 Sepsis, unspecified organism: Secondary | ICD-10-CM | POA: Diagnosis not present

## 2021-12-31 DIAGNOSIS — N184 Chronic kidney disease, stage 4 (severe): Secondary | ICD-10-CM | POA: Diagnosis not present

## 2021-12-31 DIAGNOSIS — I959 Hypotension, unspecified: Secondary | ICD-10-CM | POA: Diagnosis not present

## 2021-12-31 DIAGNOSIS — S0093XD Contusion of unspecified part of head, subsequent encounter: Secondary | ICD-10-CM | POA: Diagnosis not present

## 2022-01-01 DIAGNOSIS — H02532 Eyelid retraction right lower eyelid: Secondary | ICD-10-CM | POA: Diagnosis not present

## 2022-01-01 DIAGNOSIS — H02815 Retained foreign body in left lower eyelid: Secondary | ICD-10-CM | POA: Diagnosis not present

## 2022-01-01 DIAGNOSIS — H02052 Trichiasis without entropian right lower eyelid: Secondary | ICD-10-CM | POA: Diagnosis not present

## 2022-01-01 DIAGNOSIS — H02032 Senile entropion of right lower eyelid: Secondary | ICD-10-CM | POA: Diagnosis not present

## 2022-01-01 DIAGNOSIS — T8131XA Disruption of external operation (surgical) wound, not elsewhere classified, initial encounter: Secondary | ICD-10-CM | POA: Diagnosis not present

## 2022-01-01 DIAGNOSIS — H02042 Spastic entropion of right lower eyelid: Secondary | ICD-10-CM | POA: Diagnosis not present

## 2022-01-01 DIAGNOSIS — H02012 Cicatricial entropion of right lower eyelid: Secondary | ICD-10-CM | POA: Diagnosis not present

## 2022-01-04 DIAGNOSIS — I951 Orthostatic hypotension: Secondary | ICD-10-CM | POA: Diagnosis not present

## 2022-01-04 DIAGNOSIS — D72829 Elevated white blood cell count, unspecified: Secondary | ICD-10-CM | POA: Diagnosis not present

## 2022-01-04 DIAGNOSIS — I959 Hypotension, unspecified: Secondary | ICD-10-CM | POA: Diagnosis not present

## 2022-01-04 DIAGNOSIS — E785 Hyperlipidemia, unspecified: Secondary | ICD-10-CM | POA: Diagnosis not present

## 2022-01-04 DIAGNOSIS — J449 Chronic obstructive pulmonary disease, unspecified: Secondary | ICD-10-CM | POA: Diagnosis not present

## 2022-01-04 DIAGNOSIS — F17210 Nicotine dependence, cigarettes, uncomplicated: Secondary | ICD-10-CM | POA: Diagnosis not present

## 2022-01-04 DIAGNOSIS — N189 Chronic kidney disease, unspecified: Secondary | ICD-10-CM | POA: Diagnosis not present

## 2022-01-04 DIAGNOSIS — N183 Chronic kidney disease, stage 3 unspecified: Secondary | ICD-10-CM | POA: Diagnosis not present

## 2022-01-04 DIAGNOSIS — D649 Anemia, unspecified: Secondary | ICD-10-CM | POA: Diagnosis not present

## 2022-01-04 DIAGNOSIS — J189 Pneumonia, unspecified organism: Secondary | ICD-10-CM | POA: Diagnosis not present

## 2022-02-28 DIAGNOSIS — I959 Hypotension, unspecified: Secondary | ICD-10-CM | POA: Diagnosis not present

## 2022-02-28 DIAGNOSIS — F17210 Nicotine dependence, cigarettes, uncomplicated: Secondary | ICD-10-CM | POA: Diagnosis not present

## 2022-02-28 DIAGNOSIS — N183 Chronic kidney disease, stage 3 unspecified: Secondary | ICD-10-CM | POA: Diagnosis not present

## 2022-02-28 DIAGNOSIS — R634 Abnormal weight loss: Secondary | ICD-10-CM | POA: Diagnosis not present

## 2022-02-28 DIAGNOSIS — I714 Abdominal aortic aneurysm, without rupture, unspecified: Secondary | ICD-10-CM | POA: Diagnosis not present

## 2022-02-28 DIAGNOSIS — K5790 Diverticulosis of intestine, part unspecified, without perforation or abscess without bleeding: Secondary | ICD-10-CM | POA: Diagnosis not present

## 2022-02-28 DIAGNOSIS — M069 Rheumatoid arthritis, unspecified: Secondary | ICD-10-CM | POA: Diagnosis not present

## 2022-02-28 DIAGNOSIS — J449 Chronic obstructive pulmonary disease, unspecified: Secondary | ICD-10-CM | POA: Diagnosis not present

## 2022-02-28 DIAGNOSIS — F0390 Unspecified dementia without behavioral disturbance: Secondary | ICD-10-CM | POA: Diagnosis not present

## 2022-03-08 DIAGNOSIS — M0579 Rheumatoid arthritis with rheumatoid factor of multiple sites without organ or systems involvement: Secondary | ICD-10-CM | POA: Diagnosis not present

## 2022-03-08 DIAGNOSIS — N184 Chronic kidney disease, stage 4 (severe): Secondary | ICD-10-CM | POA: Diagnosis not present

## 2022-03-08 DIAGNOSIS — Z79899 Other long term (current) drug therapy: Secondary | ICD-10-CM | POA: Diagnosis not present

## 2022-05-09 DIAGNOSIS — M79674 Pain in right toe(s): Secondary | ICD-10-CM | POA: Diagnosis not present

## 2022-05-09 DIAGNOSIS — M79675 Pain in left toe(s): Secondary | ICD-10-CM | POA: Diagnosis not present

## 2022-05-09 DIAGNOSIS — M0579 Rheumatoid arthritis with rheumatoid factor of multiple sites without organ or systems involvement: Secondary | ICD-10-CM | POA: Diagnosis not present

## 2022-05-09 DIAGNOSIS — B351 Tinea unguium: Secondary | ICD-10-CM | POA: Diagnosis not present

## 2022-06-06 DIAGNOSIS — I951 Orthostatic hypotension: Secondary | ICD-10-CM | POA: Diagnosis not present

## 2022-06-06 DIAGNOSIS — R634 Abnormal weight loss: Secondary | ICD-10-CM | POA: Diagnosis not present

## 2022-06-06 DIAGNOSIS — J449 Chronic obstructive pulmonary disease, unspecified: Secondary | ICD-10-CM | POA: Diagnosis not present

## 2022-06-06 DIAGNOSIS — M069 Rheumatoid arthritis, unspecified: Secondary | ICD-10-CM | POA: Diagnosis not present

## 2022-06-06 DIAGNOSIS — Z0000 Encounter for general adult medical examination without abnormal findings: Secondary | ICD-10-CM | POA: Diagnosis not present

## 2022-06-06 DIAGNOSIS — I959 Hypotension, unspecified: Secondary | ICD-10-CM | POA: Diagnosis not present

## 2022-06-06 DIAGNOSIS — J439 Emphysema, unspecified: Secondary | ICD-10-CM | POA: Diagnosis not present

## 2022-06-06 DIAGNOSIS — M059 Rheumatoid arthritis with rheumatoid factor, unspecified: Secondary | ICD-10-CM | POA: Diagnosis not present

## 2022-06-06 DIAGNOSIS — D472 Monoclonal gammopathy: Secondary | ICD-10-CM | POA: Diagnosis not present

## 2022-06-06 DIAGNOSIS — F0390 Unspecified dementia without behavioral disturbance: Secondary | ICD-10-CM | POA: Diagnosis not present

## 2022-06-06 DIAGNOSIS — E785 Hyperlipidemia, unspecified: Secondary | ICD-10-CM | POA: Diagnosis not present

## 2022-06-06 DIAGNOSIS — N189 Chronic kidney disease, unspecified: Secondary | ICD-10-CM | POA: Diagnosis not present

## 2022-06-06 DIAGNOSIS — N184 Chronic kidney disease, stage 4 (severe): Secondary | ICD-10-CM | POA: Diagnosis not present

## 2022-06-06 DIAGNOSIS — I6350 Cerebral infarction due to unspecified occlusion or stenosis of unspecified cerebral artery: Secondary | ICD-10-CM | POA: Diagnosis not present

## 2022-06-06 DIAGNOSIS — I714 Abdominal aortic aneurysm, without rupture, unspecified: Secondary | ICD-10-CM | POA: Diagnosis not present

## 2022-06-06 DIAGNOSIS — R42 Dizziness and giddiness: Secondary | ICD-10-CM | POA: Diagnosis not present

## 2022-06-21 ENCOUNTER — Other Ambulatory Visit: Payer: Self-pay

## 2022-06-21 ENCOUNTER — Emergency Department: Payer: Medicare HMO

## 2022-06-21 ENCOUNTER — Emergency Department
Admission: EM | Admit: 2022-06-21 | Discharge: 2022-06-21 | Disposition: A | Discharge status: Home / Self Care | Payer: Medicare HMO | Attending: Emergency Medicine | Admitting: Emergency Medicine

## 2022-06-21 DIAGNOSIS — S0990XA Unspecified injury of head, initial encounter: Secondary | ICD-10-CM | POA: Diagnosis not present

## 2022-06-21 DIAGNOSIS — I129 Hypertensive chronic kidney disease with stage 1 through stage 4 chronic kidney disease, or unspecified chronic kidney disease: Secondary | ICD-10-CM | POA: Diagnosis not present

## 2022-06-21 DIAGNOSIS — W0110XA Fall on same level from slipping, tripping and stumbling with subsequent striking against unspecified object, initial encounter: Secondary | ICD-10-CM | POA: Insufficient documentation

## 2022-06-21 DIAGNOSIS — S61412A Laceration without foreign body of left hand, initial encounter: Secondary | ICD-10-CM | POA: Diagnosis not present

## 2022-06-21 DIAGNOSIS — Z8673 Personal history of transient ischemic attack (TIA), and cerebral infarction without residual deficits: Secondary | ICD-10-CM | POA: Diagnosis not present

## 2022-06-21 DIAGNOSIS — N189 Chronic kidney disease, unspecified: Secondary | ICD-10-CM | POA: Diagnosis not present

## 2022-06-21 DIAGNOSIS — W19XXXA Unspecified fall, initial encounter: Secondary | ICD-10-CM

## 2022-06-21 DIAGNOSIS — M47812 Spondylosis without myelopathy or radiculopathy, cervical region: Secondary | ICD-10-CM | POA: Diagnosis not present

## 2022-06-21 DIAGNOSIS — S0181XA Laceration without foreign body of other part of head, initial encounter: Secondary | ICD-10-CM | POA: Diagnosis not present

## 2022-06-21 DIAGNOSIS — M542 Cervicalgia: Secondary | ICD-10-CM | POA: Diagnosis not present

## 2022-06-21 DIAGNOSIS — G9389 Other specified disorders of brain: Secondary | ICD-10-CM | POA: Diagnosis not present

## 2022-06-21 MED ORDER — BACITRACIN ZINC 500 UNIT/GM EX OINT: TOPICAL_OINTMENT | Freq: Two times a day (BID) | CUTANEOUS | Status: DC

## 2022-06-21 MED ORDER — BACITRACIN ZINC 500 UNIT/GM EX OINT
TOPICAL_OINTMENT | CUTANEOUS | Status: AC
  Filled 2022-06-21: qty 0.9

## 2022-06-21 NOTE — ED Triage Notes (Signed)
Pt comes with c/o two falls today. Pt has skin to left side face and then had another fall outside on concrete falling backwards hitting head. Pt is on thinners. Pt is A*OX4.

## 2022-06-21 NOTE — ED Provider Notes (Signed)
   [facility] Provider Note    Event Date/Time   First MD Initiated Contact with Patient 06/21/22 1556     (approximate)   History   Fall   HPI  KIRAH STICE is a 89 y.o. female with history of chronic kidney disease, hypertension, CVA who presents after a fall.  Patient reports she lost her balance when she was going to get her Meals on Wheels at the door.  Fell backward hit her head.  Suffered mild skin tear to the left hand as well.  She feels well and is not happy that she was brought to the emergency department     Physical Exam   Triage Vital Signs: ED Triage Vitals  Enc Vitals Group     BP 06/21/22 1432 95/68     Pulse Rate 06/21/22 1432 88     Resp 06/21/22 1432 14     Temp 06/21/22 1432 98.6 F (37 C)     Temp Source 06/21/22 1432 Oral     SpO2 06/21/22 1432 100 %     Weight --      Height --      Head Circumference --      Peak Flow --      Pain Score 06/21/22 1414 4     Pain Loc --      Pain Edu? --      Excl. in [place]? --     Most recent vital signs: Vitals:   06/21/22 1432 06/21/22 1613  BP: 95/68 123/68  Pulse: 88 88  Resp: 14 16  Temp: 98.6 F (37 C) 98.6 F (37 C)  SpO2: 100% 99%     General: Awake, no distress.  CV:  Good peripheral perfusion.  Resp:  Normal effort.  Abd:  No distention.  Other:  Minor skin tear to the dorsal aspect of the left hand, small skin tear to the right outer aspect of the eyebrow   ED Results / Procedures / Treatments   Labs (all labs ordered are listed, but only abnormal results are displayed) Labs Reviewed - No data to display   EKG     RADIOLOGY CT head viewed by me, no ICH    PROCEDURES:  Critical Care performed:   Procedures   MEDICATIONS ORDERED IN ED: Medications  bacitracin ointment ( Topical Given 06/21/22 1613)     IMPRESSION / MDM / [place] / ED COURSE  I reviewed the triage vital signs and the nursing notes. Patient's presentation is  most consistent with acute presentation with potential threat to life or bodily function.  Patient presents with head injury as above, differential includes mild head injury, ICH, concussion  Exam is reassuring, no neurodeficits.  CT head and cervical spine are reassuring.  Mild skin tears dressed  No indication for admission at this time, exam otherwise reassuring, appropriate for discharge at this time        FINAL CLINICAL IMPRESSION(S) / ED DIAGNOSES   Final diagnoses:  Fall, initial encounter  Injury of head, initial encounter     Rx / DC Orders   ED Discharge Orders     None        Note:  This document was prepared using Dragon voice recognition software and may include unintentional dictation errors.   Lavonia Drafts, MD 06/21/22 [phone]

## 2022-06-21 NOTE — ED Notes (Signed)
Pt discharge to home. Pt VSS, GCS 15, NAD. Pt verbalized understanding of discharge instructions with no additional questions at this time.  

## 2022-07-18 DIAGNOSIS — R29898 Other symptoms and signs involving the musculoskeletal system: Secondary | ICD-10-CM | POA: Diagnosis not present

## 2022-07-18 DIAGNOSIS — M0579 Rheumatoid arthritis with rheumatoid factor of multiple sites without organ or systems involvement: Secondary | ICD-10-CM | POA: Diagnosis not present

## 2022-07-18 DIAGNOSIS — Z79899 Other long term (current) drug therapy: Secondary | ICD-10-CM | POA: Diagnosis not present

## 2022-07-18 DIAGNOSIS — N184 Chronic kidney disease, stage 4 (severe): Secondary | ICD-10-CM | POA: Diagnosis not present

## 2022-07-22 DIAGNOSIS — N184 Chronic kidney disease, stage 4 (severe): Secondary | ICD-10-CM | POA: Diagnosis not present

## 2022-07-22 DIAGNOSIS — I129 Hypertensive chronic kidney disease with stage 1 through stage 4 chronic kidney disease, or unspecified chronic kidney disease: Secondary | ICD-10-CM | POA: Diagnosis not present

## 2022-08-07 DIAGNOSIS — Z8719 Personal history of other diseases of the digestive system: Secondary | ICD-10-CM | POA: Diagnosis not present

## 2022-08-07 DIAGNOSIS — Z9889 Other specified postprocedural states: Secondary | ICD-10-CM | POA: Diagnosis not present

## 2022-08-07 DIAGNOSIS — R634 Abnormal weight loss: Secondary | ICD-10-CM | POA: Diagnosis not present

## 2022-08-07 DIAGNOSIS — K224 Dyskinesia of esophagus: Secondary | ICD-10-CM | POA: Diagnosis not present

## 2022-09-05 DIAGNOSIS — F17200 Nicotine dependence, unspecified, uncomplicated: Secondary | ICD-10-CM | POA: Diagnosis not present

## 2022-09-05 DIAGNOSIS — J449 Chronic obstructive pulmonary disease, unspecified: Secondary | ICD-10-CM | POA: Diagnosis not present

## 2022-09-05 DIAGNOSIS — N189 Chronic kidney disease, unspecified: Secondary | ICD-10-CM | POA: Diagnosis not present

## 2022-09-05 DIAGNOSIS — I951 Orthostatic hypotension: Secondary | ICD-10-CM | POA: Diagnosis not present

## 2022-09-05 DIAGNOSIS — E785 Hyperlipidemia, unspecified: Secondary | ICD-10-CM | POA: Diagnosis not present

## 2022-09-05 DIAGNOSIS — M069 Rheumatoid arthritis, unspecified: Secondary | ICD-10-CM | POA: Diagnosis not present

## 2022-09-05 DIAGNOSIS — R634 Abnormal weight loss: Secondary | ICD-10-CM | POA: Diagnosis not present

## 2022-11-12 DIAGNOSIS — R29898 Other symptoms and signs involving the musculoskeletal system: Secondary | ICD-10-CM | POA: Diagnosis not present

## 2022-11-12 DIAGNOSIS — Z79899 Other long term (current) drug therapy: Secondary | ICD-10-CM | POA: Diagnosis not present

## 2022-11-12 DIAGNOSIS — M0579 Rheumatoid arthritis with rheumatoid factor of multiple sites without organ or systems involvement: Secondary | ICD-10-CM | POA: Diagnosis not present

## 2022-11-12 DIAGNOSIS — N184 Chronic kidney disease, stage 4 (severe): Secondary | ICD-10-CM | POA: Diagnosis not present

## 2022-11-12 DIAGNOSIS — M810 Age-related osteoporosis without current pathological fracture: Secondary | ICD-10-CM | POA: Diagnosis not present

## 2022-12-26 DIAGNOSIS — Z03818 Encounter for observation for suspected exposure to other biological agents ruled out: Secondary | ICD-10-CM | POA: Diagnosis not present

## 2022-12-26 DIAGNOSIS — J029 Acute pharyngitis, unspecified: Secondary | ICD-10-CM | POA: Diagnosis not present

## 2022-12-26 DIAGNOSIS — M0579 Rheumatoid arthritis with rheumatoid factor of multiple sites without organ or systems involvement: Secondary | ICD-10-CM | POA: Diagnosis not present

## 2022-12-26 DIAGNOSIS — B351 Tinea unguium: Secondary | ICD-10-CM | POA: Diagnosis not present

## 2022-12-26 DIAGNOSIS — L03031 Cellulitis of right toe: Secondary | ICD-10-CM | POA: Diagnosis not present

## 2022-12-26 DIAGNOSIS — M79674 Pain in right toe(s): Secondary | ICD-10-CM | POA: Diagnosis not present

## 2022-12-26 DIAGNOSIS — M79675 Pain in left toe(s): Secondary | ICD-10-CM | POA: Diagnosis not present

## 2023-01-26 DEATH — deceased

## 2023-03-22 IMAGING — CT CT ABD-PELV W/O CM
2 of 4 series · 16 of 46 positions shown, 18 images · non-contrast
Comparison: None.

CLINICAL DATA: Lower abdominal pain.

EXAM:
CT ABDOMEN AND PELVIS WITHOUT CONTRAST
TECHNIQUE: Multidetector CT imaging of the abdomen and pelvis was performed
following the standard protocol without IV contrast.

[Series 2: routine abd/pel wo · axial · 0.70mm/px · z∈[-457,-77]mm · 13 of 84 slices shown, 15 images]
[im 4/84  soft-tissue]
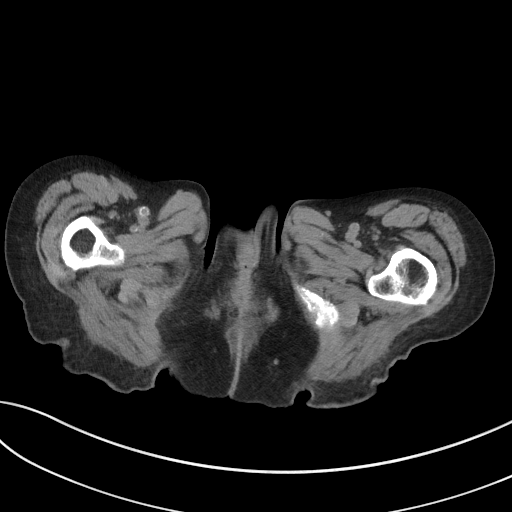
[im 4/84  bone]
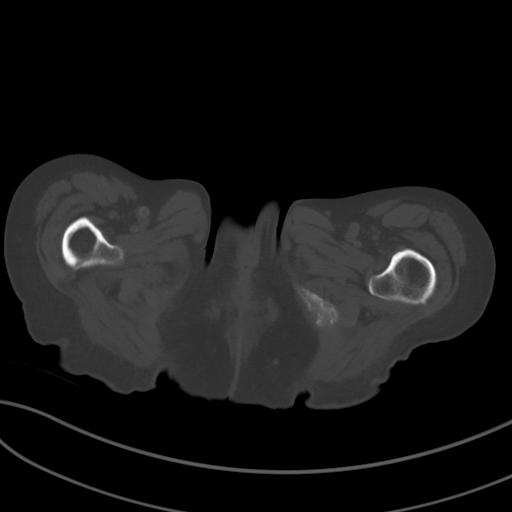
[im 11/84  soft-tissue]
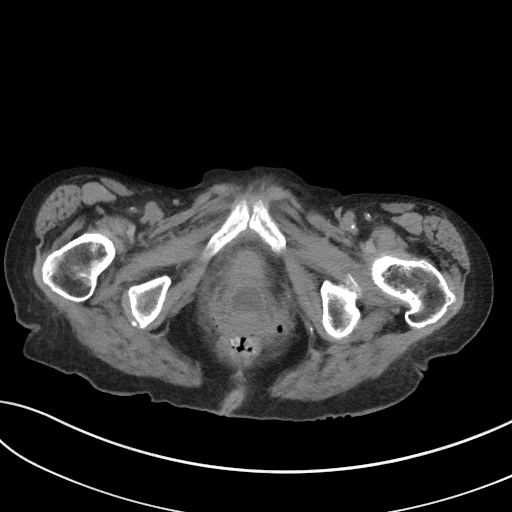
[im 18/84  soft-tissue]
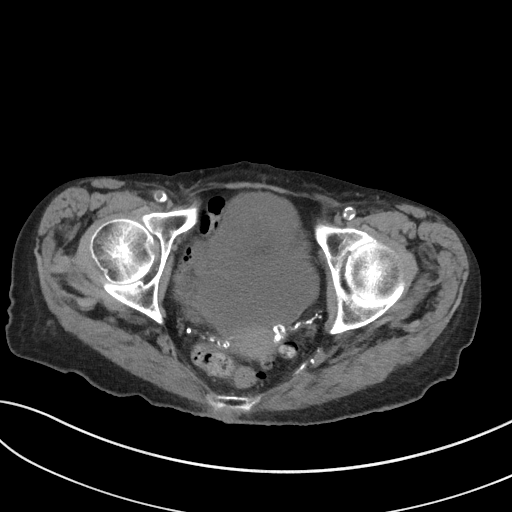
[im 25/84  soft-tissue]
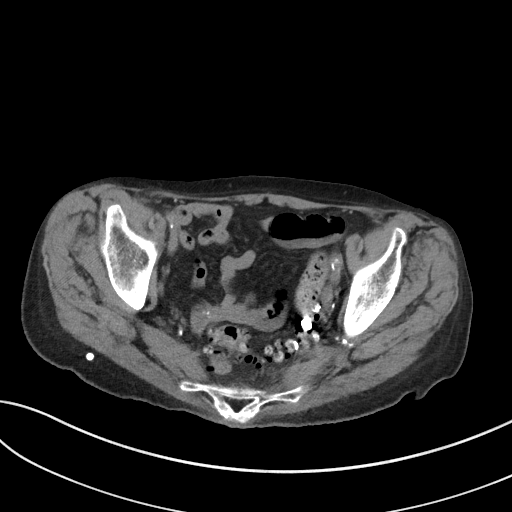
[im 28/84  soft-tissue]
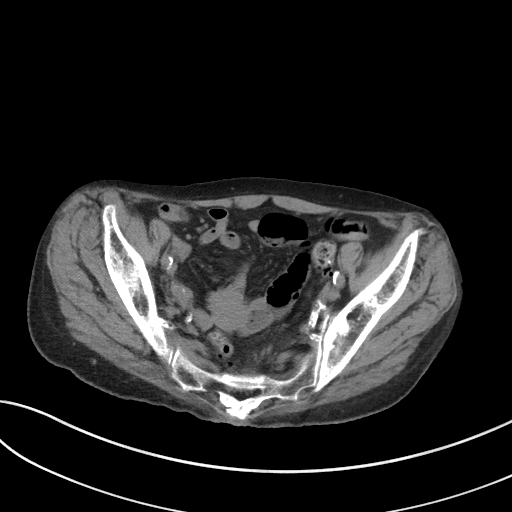
[im 35/84  soft-tissue]
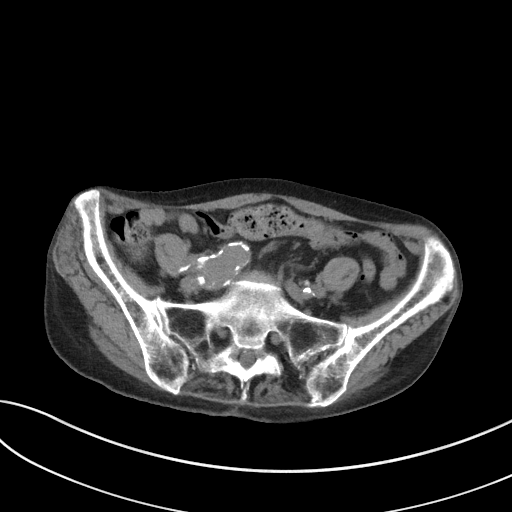
[im 42/84  soft-tissue]
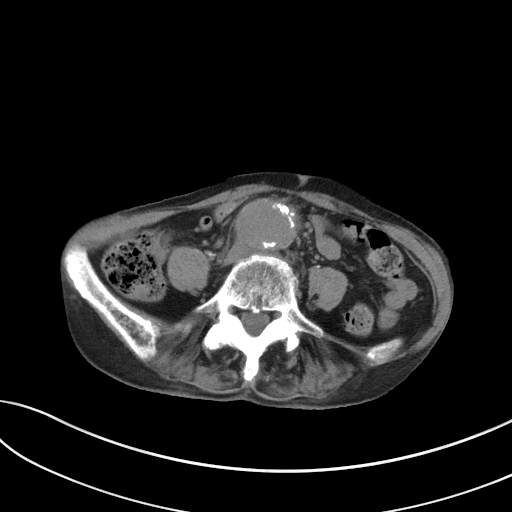
[im 49/84  soft-tissue]
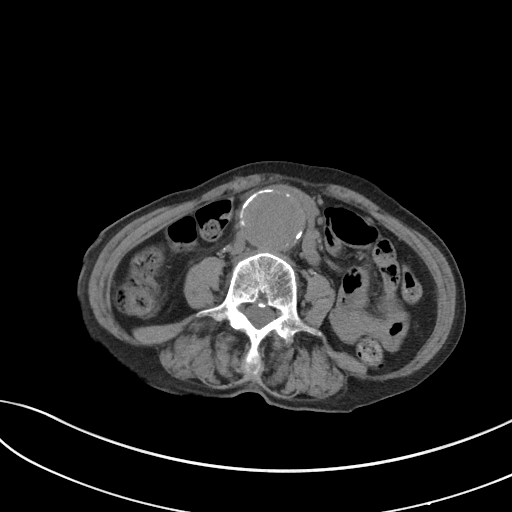
[im 56/84  soft-tissue]
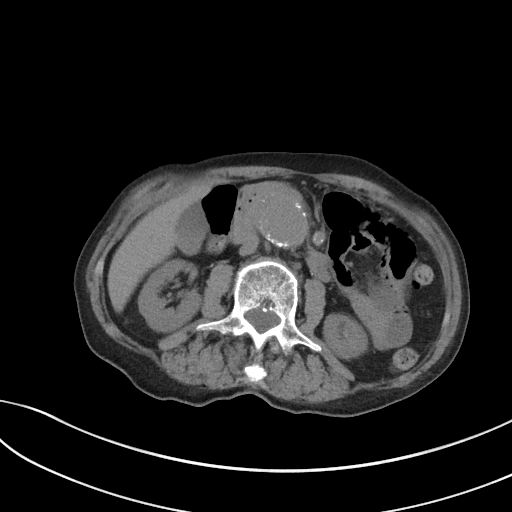
[im 56/84  bone]
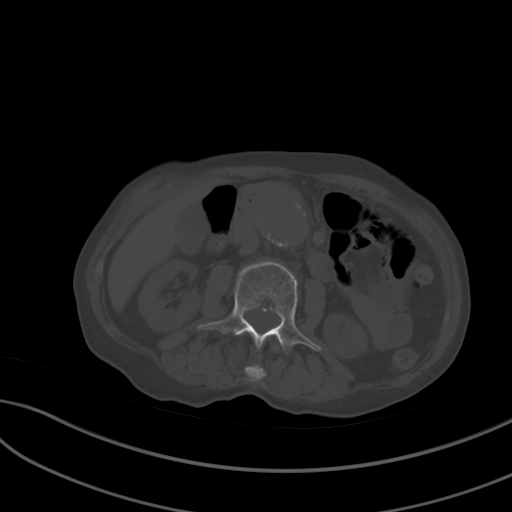
[im 59/84  soft-tissue]
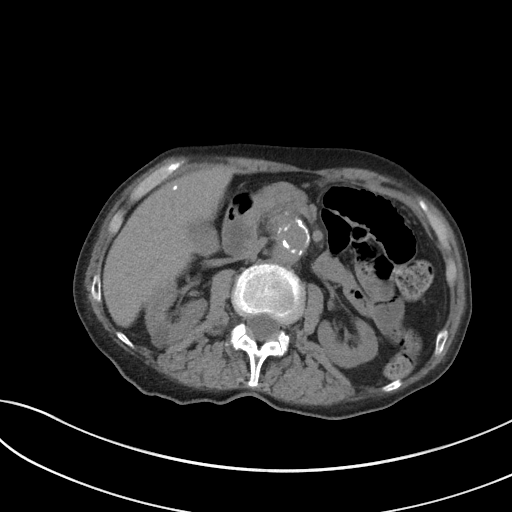
[im 66/84  soft-tissue]
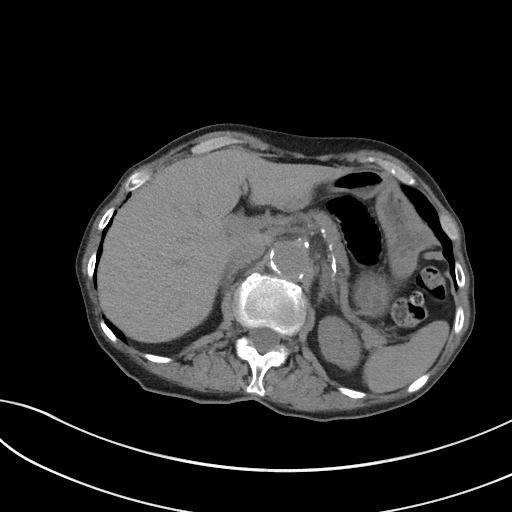
[im 73/84  soft-tissue]
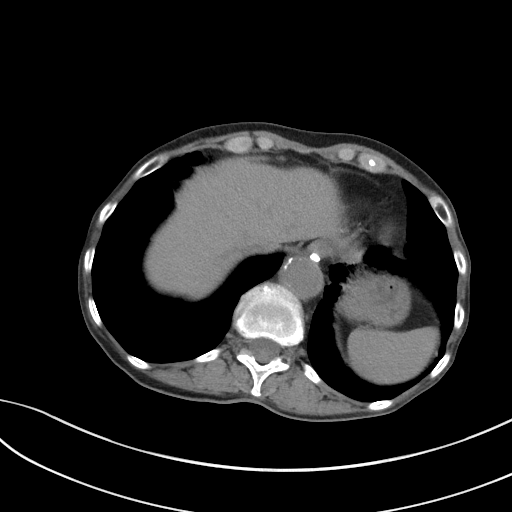
[im 80/84  soft-tissue]
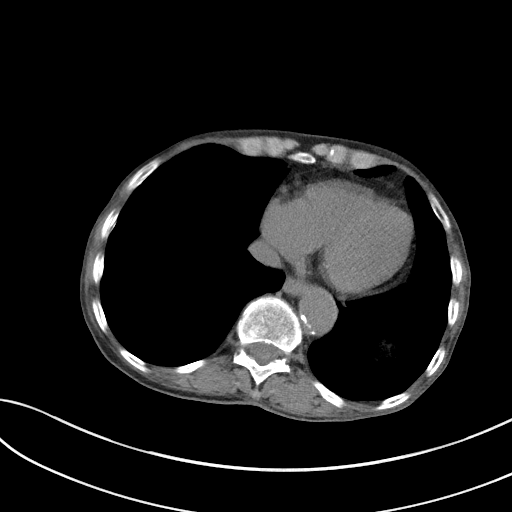

[Series 5: coronal st · coronal · 0.73mm/px · 3 of 72 slices shown]
[im 24/72  soft-tissue]
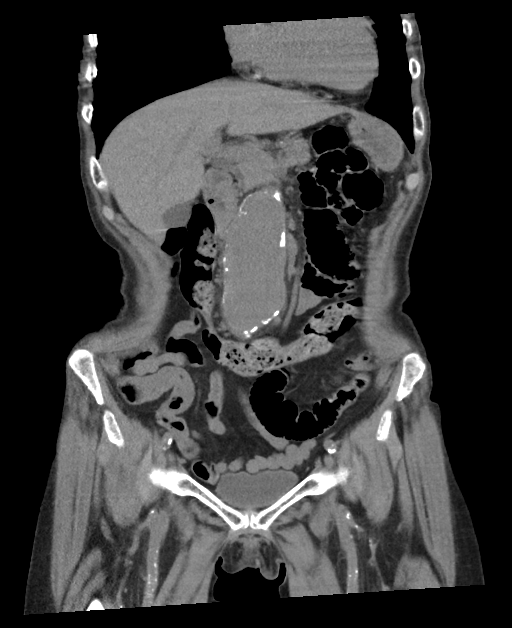
[im 32/72  soft-tissue]
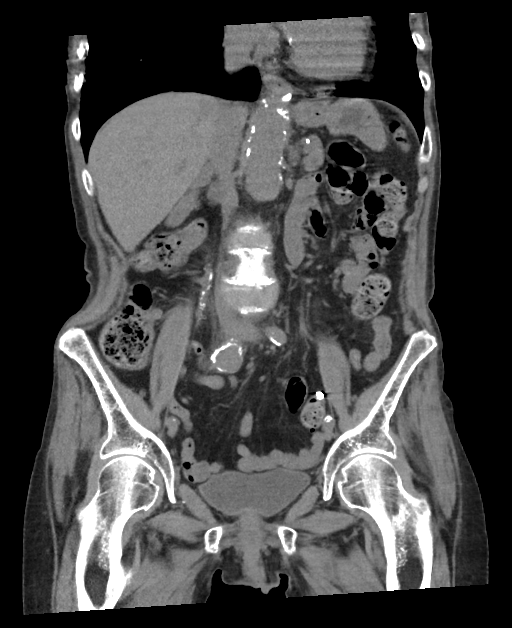
[im 40/72  soft-tissue]
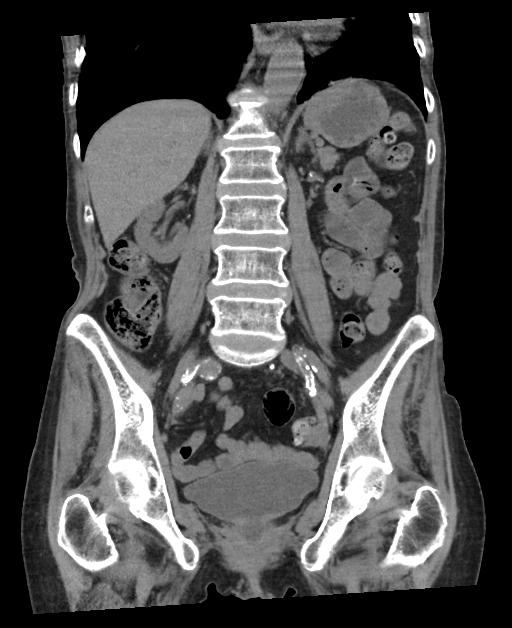

[16 of 46 positions shown; findings below may reference images not displayed]

FINDINGS: Lower chest: No acute abnormality.

Hepatobiliary: No focal liver abnormality is seen. No gallstones,
gallbladder wall thickening, or biliary dilatation.

Pancreas: Unremarkable. No pancreatic ductal dilatation or
surrounding inflammatory changes.

Spleen: Normal in size without focal abnormality.

Adrenals/Urinary Tract: Adrenal glands are unremarkable. Kidneys are
normal in size, without obstructing renal calculi or hydronephrosis.
A 4 mm nonobstructing renal stone is seen within the mid left
kidney. 1.0 cm and 1.6 cm cysts are noted within the posterior
aspect of the mid right kidney. There is mild to moderate severity
distention of an otherwise unremarkable urinary bladder.

Stomach/Bowel: Stomach is within normal limits. Appendix appears
normal. No evidence of bowel wall thickening, distention, or
inflammatory changes. Numerous diverticula are seen within the
descending and sigmoid colon.

Vascular/Lymphatic: There is diffuse calcification of the abdominal
aorta and bilateral common iliac arteries with 4.6 cm diameter
aneurysmal dilatation of the infrarenal abdominal aorta. This
measures approximately 10.0 cm in length. No enlarged abdominal or
pelvic lymph nodes.

Reproductive: Uterus and bilateral adnexa are unremarkable.

Other: No abdominal wall hernia or abnormality. No abdominopelvic
ascites.

Musculoskeletal: Degenerative changes seen throughout the lumbar
spine.
IMPRESSION: 1. 4.6 cm diameter infrarenal abdominal aortic aneurysm.
2. Colonic diverticulosis.
3. 4 mm nonobstructing renal stone within the left kidney.

## 2023-05-17 IMAGING — CT CT CHEST W/O CM
2 of 4 series · 15 of 36 positions shown, 18 images · non-contrast
Comparison: 03/20/2020

CLINICAL DATA: Follow-up left lower lobe pulmonary nodule.

EXAM:
CT CHEST WITHOUT CONTRAST
TECHNIQUE: Multidetector CT imaging of the chest was performed following the
standard protocol without IV contrast.

[Series 2: chest 2.00 · axial · 0.55mm/px · z∈[-1210,-928]mm · 12 of 167 slices shown, 15 images]
[im 13/167  mediastinal]
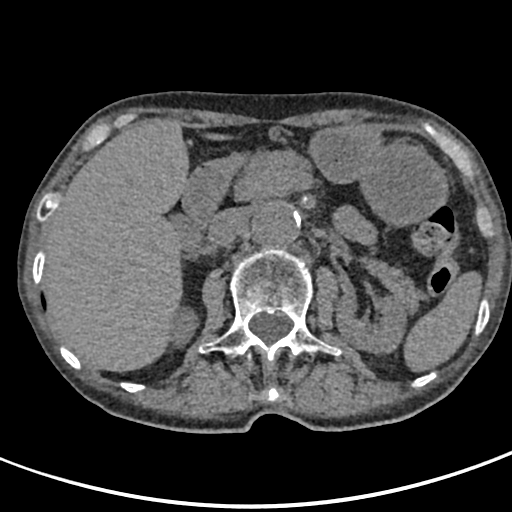
[im 13/167  lung]
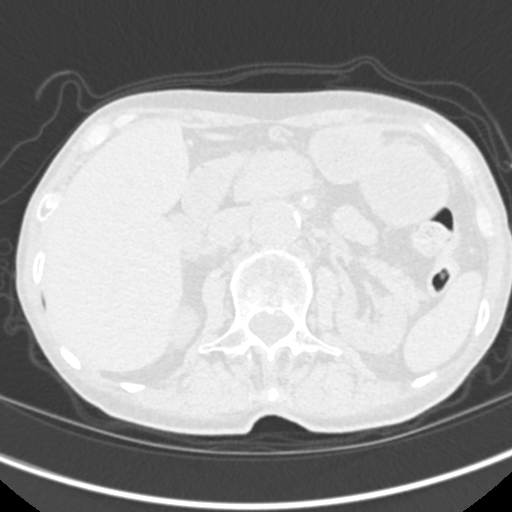
[im 26/167  lung]
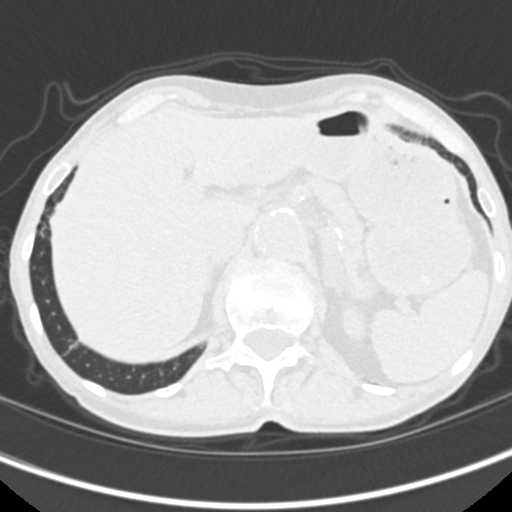
[im 39/167  lung]
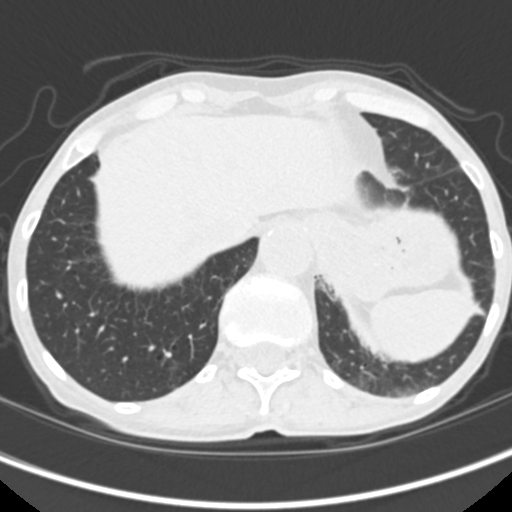
[im 52/167  lung]
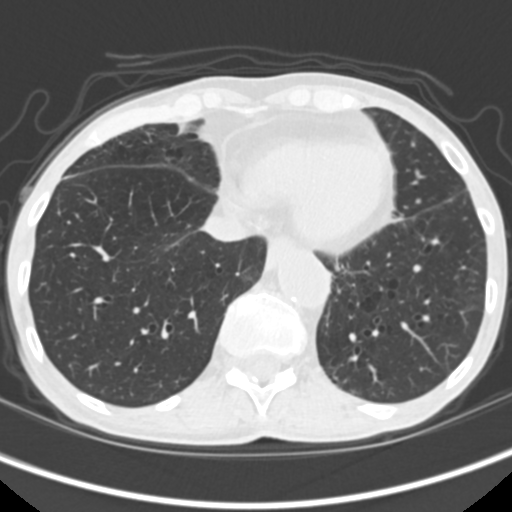
[im 64/167  mediastinal]
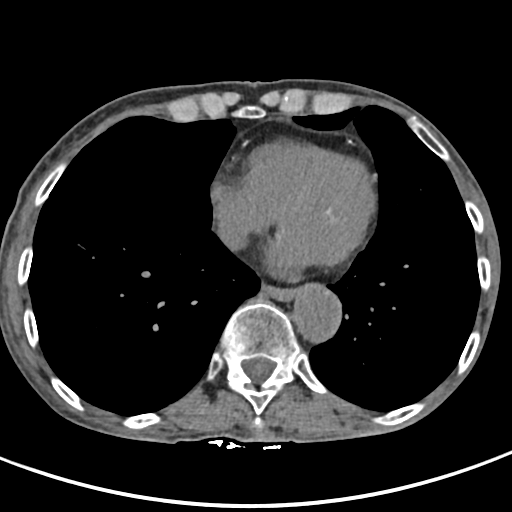
[im 64/167  lung]
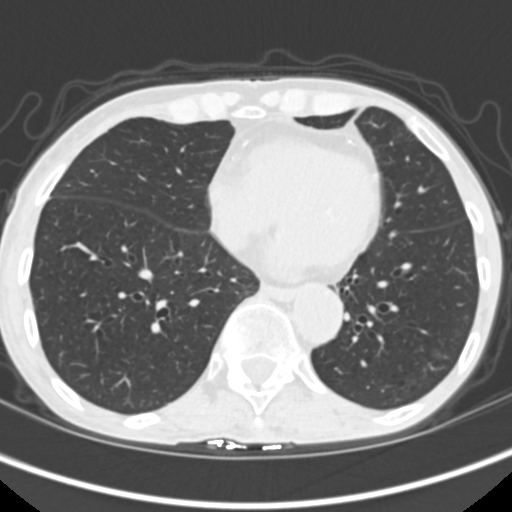
[im 77/167  lung]
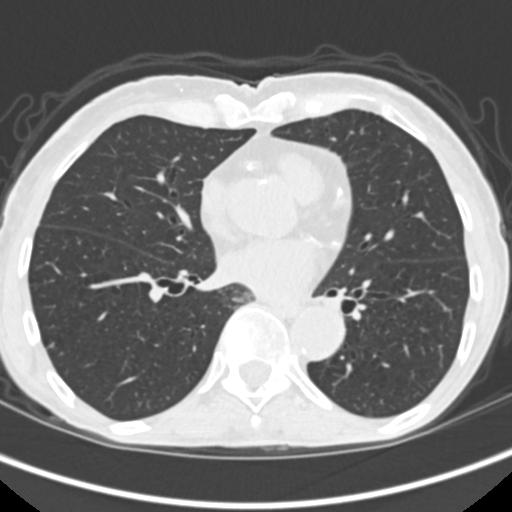
[im 90/167  lung]
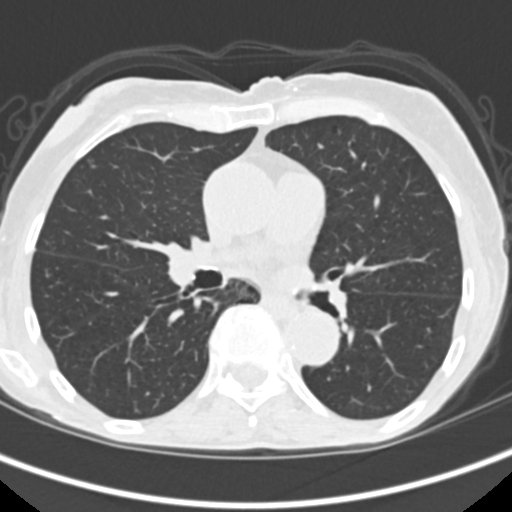
[im 103/167  lung]
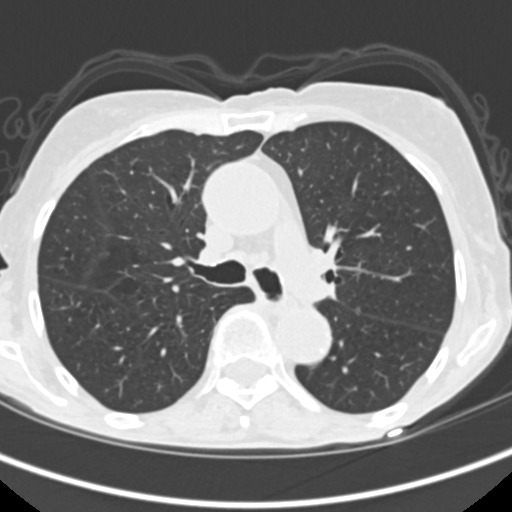
[im 115/167  mediastinal]
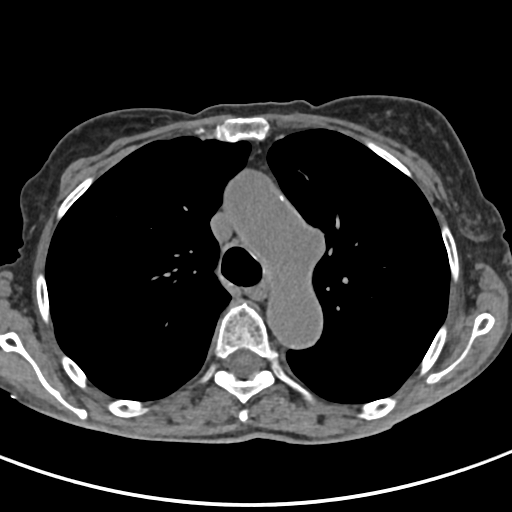
[im 115/167  lung]
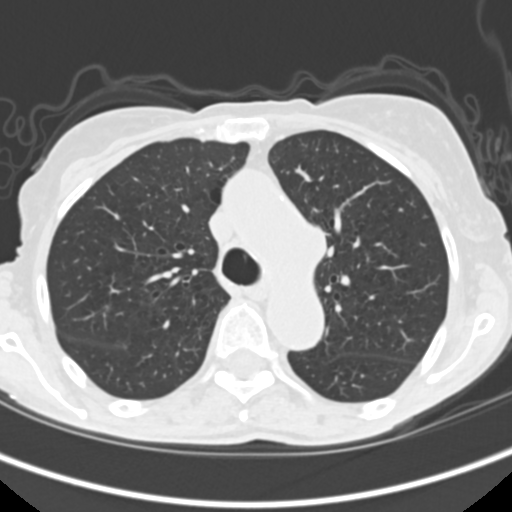
[im 128/167  lung]
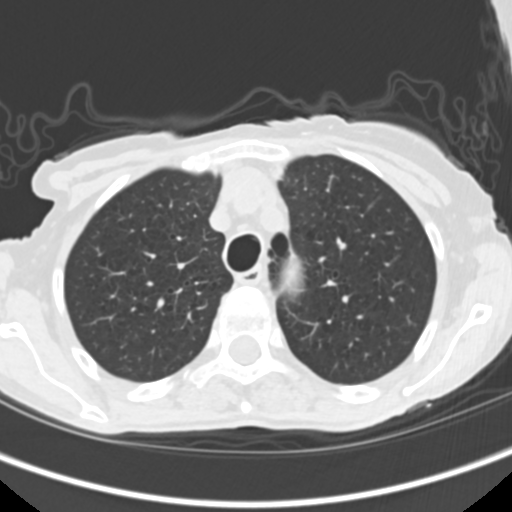
[im 141/167  lung]
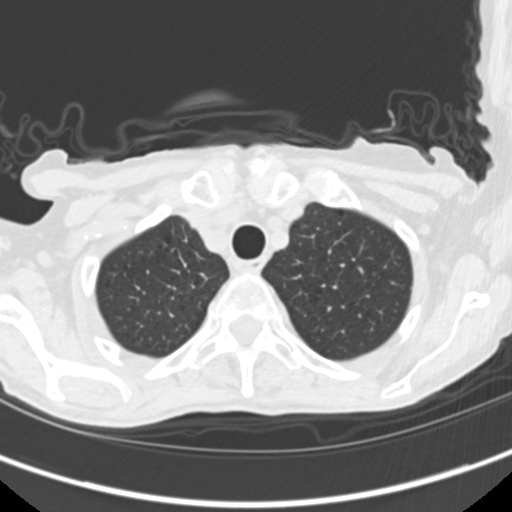
[im 154/167  lung]
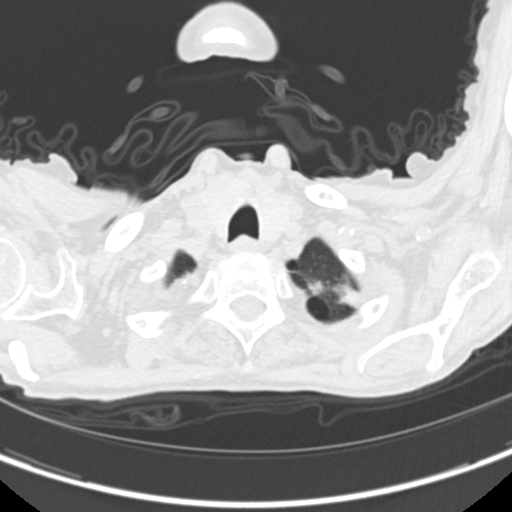

[Series 5: coronals chest 2.00 cor · coronal · 0.56mm/px · 3 of 107 slices shown]
[im 22/107  lung]
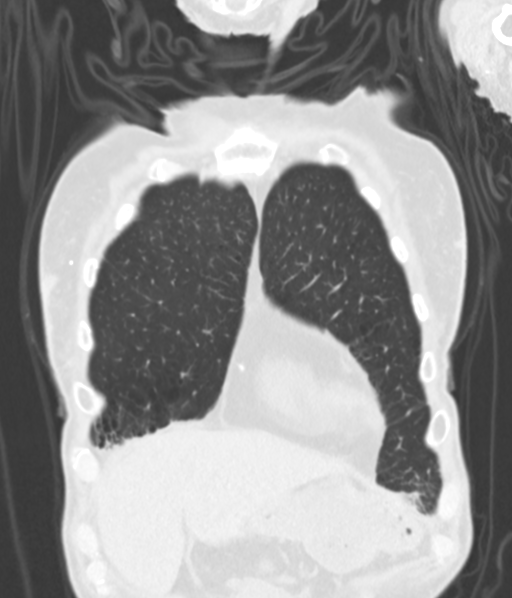
[im 43/107  lung]
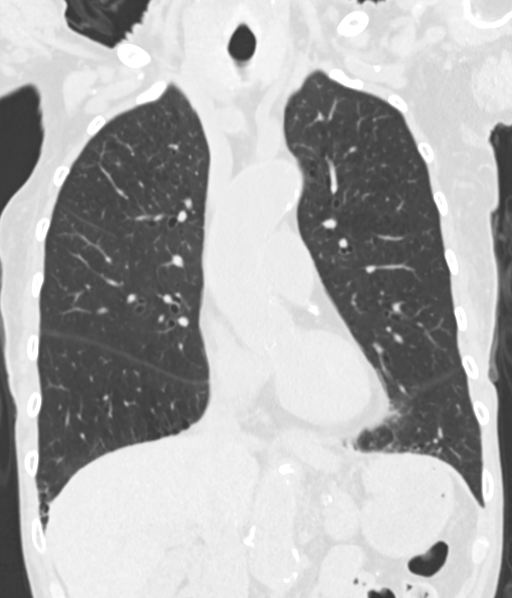
[im 64/107  lung]
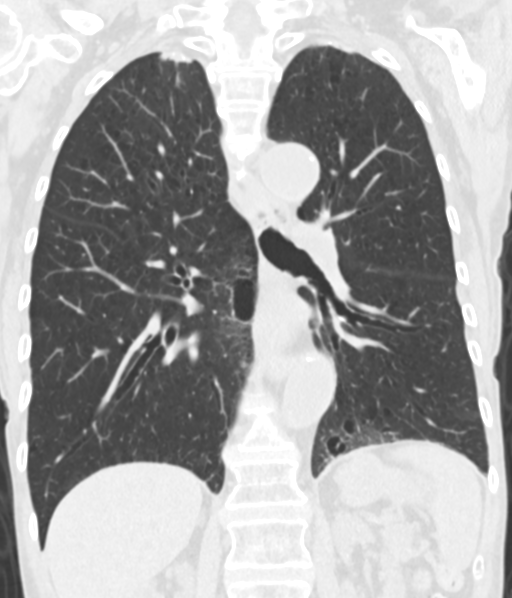

[15 of 36 positions shown; findings below may reference images not displayed]

FINDINGS: Cardiovascular: Aortic atherosclerosis. Ascending thoracic aorta
measures up to 3.9 x 3.9 cm. Unchanged focal aneurysm of the lateral
aspect of the distal aortic arch, maximum caliber of the vessel in
this vicinity 3.7 x 3.3 cm (series 2, image 53). Normal heart size.
Three-vessel coronary artery calcifications. No pericardial
effusion.

Mediastinum/Nodes: No enlarged mediastinal, hilar, or axillary lymph
nodes. Thyroid gland, trachea, and esophagus demonstrate no
significant findings.

Lungs/Pleura: Moderate centrilobular and paraseptal emphysema.
Stable, benign 5 mm pulmonary nodule of the left lower lobe (series
3, image 114). Mild bibasilar scarring. No pleural effusion or
pneumothorax.

Upper Abdomen: No acute abnormality.

Musculoskeletal: No chest wall mass or suspicious bone lesions
identified.
IMPRESSION: 1. Stable, benign 5 mm pulmonary nodule of the left lower lobe, for
which no further follow-up or characterization is required.
2. Moderate emphysema.
3. Coronary artery disease.
4. Unchanged focal aneurysm of the lateral aspect of the distal
aortic arch, maximum caliber of the vessel in this vicinity 3.7 x
3.3 cm. Recommend annual imaging followup by CTA or MRA if
clinically appropriate. This recommendation follows 5393
ACCF/AHA/AATS/ACR/ASA/SCA/SHIMLA/ERXLEBEN/SOBERANES/IZZATIE Guidelines for the
Diagnosis and Management of Patients with Thoracic Aortic Disease.
Circulation.5393; 121: E266-e369. Aortic aneurysm NOS (X5DFA-VOX.V)

Aortic Atherosclerosis (X5DFA-KMR.R) and Emphysema (X5DFA-5XR.O).
